# Patient Record
Sex: Male | Born: 1940 | Race: Black or African American | Hispanic: No | Smoking: Former smoker
Health system: Southern US, Community
[De-identification: ages and names within clinical notes are randomized; demographics above are authoritative.]

## PROBLEM LIST (undated history)

## (undated) DIAGNOSIS — I219 Acute myocardial infarction, unspecified: Secondary | ICD-10-CM

## (undated) DIAGNOSIS — I2510 Atherosclerotic heart disease of native coronary artery without angina pectoris: Secondary | ICD-10-CM

## (undated) DIAGNOSIS — I10 Essential (primary) hypertension: Secondary | ICD-10-CM

## (undated) DIAGNOSIS — Z992 Dependence on renal dialysis: Secondary | ICD-10-CM

## (undated) DIAGNOSIS — E119 Type 2 diabetes mellitus without complications: Secondary | ICD-10-CM

## (undated) DIAGNOSIS — N186 End stage renal disease: Secondary | ICD-10-CM

## (undated) HISTORY — PX: OTHER SURGICAL HISTORY: SHX169

## (undated) HISTORY — PX: EYE SURGERY: SHX253

---

## 2019-02-11 ENCOUNTER — Emergency Department: Payer: Medicare Other

## 2019-02-11 ENCOUNTER — Inpatient Hospital Stay: Payer: Medicare Other

## 2019-02-11 ENCOUNTER — Other Ambulatory Visit: Payer: Self-pay

## 2019-02-11 ENCOUNTER — Encounter: Payer: Self-pay | Admitting: Emergency Medicine

## 2019-02-11 ENCOUNTER — Inpatient Hospital Stay
Admission: EM | Admit: 2019-02-11 | Discharge: 2019-02-17 | DRG: 056 | Disposition: A | Discharge status: Skilled Nursing Facility | Payer: Medicare Other | Attending: Internal Medicine | Admitting: Internal Medicine

## 2019-02-11 DIAGNOSIS — E1122 Type 2 diabetes mellitus with diabetic chronic kidney disease: Secondary | ICD-10-CM | POA: Diagnosis present

## 2019-02-11 DIAGNOSIS — Z79899 Other long term (current) drug therapy: Secondary | ICD-10-CM

## 2019-02-11 DIAGNOSIS — R509 Fever, unspecified: Secondary | ICD-10-CM | POA: Diagnosis not present

## 2019-02-11 DIAGNOSIS — N3000 Acute cystitis without hematuria: Secondary | ICD-10-CM | POA: Diagnosis present

## 2019-02-11 DIAGNOSIS — G9341 Metabolic encephalopathy: Secondary | ICD-10-CM | POA: Diagnosis present

## 2019-02-11 DIAGNOSIS — R29701 NIHSS score 1: Secondary | ICD-10-CM | POA: Diagnosis present

## 2019-02-11 DIAGNOSIS — R4182 Altered mental status, unspecified: Secondary | ICD-10-CM | POA: Diagnosis present

## 2019-02-11 DIAGNOSIS — E785 Hyperlipidemia, unspecified: Secondary | ICD-10-CM | POA: Diagnosis present

## 2019-02-11 DIAGNOSIS — Z833 Family history of diabetes mellitus: Secondary | ICD-10-CM

## 2019-02-11 DIAGNOSIS — D631 Anemia in chronic kidney disease: Secondary | ICD-10-CM | POA: Diagnosis present

## 2019-02-11 DIAGNOSIS — I252 Old myocardial infarction: Secondary | ICD-10-CM | POA: Diagnosis not present

## 2019-02-11 DIAGNOSIS — Z20828 Contact with and (suspected) exposure to other viral communicable diseases: Secondary | ICD-10-CM | POA: Diagnosis present

## 2019-02-11 DIAGNOSIS — I340 Nonrheumatic mitral (valve) insufficiency: Secondary | ICD-10-CM | POA: Diagnosis not present

## 2019-02-11 DIAGNOSIS — Z87891 Personal history of nicotine dependence: Secondary | ICD-10-CM | POA: Diagnosis not present

## 2019-02-11 DIAGNOSIS — R5383 Other fatigue: Secondary | ICD-10-CM | POA: Diagnosis not present

## 2019-02-11 DIAGNOSIS — E1151 Type 2 diabetes mellitus with diabetic peripheral angiopathy without gangrene: Secondary | ICD-10-CM | POA: Diagnosis present

## 2019-02-11 DIAGNOSIS — B9620 Unspecified Escherichia coli [E. coli] as the cause of diseases classified elsewhere: Secondary | ICD-10-CM | POA: Diagnosis present

## 2019-02-11 DIAGNOSIS — Z1612 Extended spectrum beta lactamase (ESBL) resistance: Secondary | ICD-10-CM | POA: Diagnosis present

## 2019-02-11 DIAGNOSIS — R531 Weakness: Secondary | ICD-10-CM | POA: Diagnosis not present

## 2019-02-11 DIAGNOSIS — N2581 Secondary hyperparathyroidism of renal origin: Secondary | ICD-10-CM | POA: Diagnosis present

## 2019-02-11 DIAGNOSIS — I361 Nonrheumatic tricuspid (valve) insufficiency: Secondary | ICD-10-CM | POA: Diagnosis not present

## 2019-02-11 DIAGNOSIS — R4701 Aphasia: Secondary | ICD-10-CM | POA: Diagnosis present

## 2019-02-11 DIAGNOSIS — I120 Hypertensive chronic kidney disease with stage 5 chronic kidney disease or end stage renal disease: Secondary | ICD-10-CM | POA: Diagnosis present

## 2019-02-11 DIAGNOSIS — N400 Enlarged prostate without lower urinary tract symptoms: Secondary | ICD-10-CM | POA: Diagnosis present

## 2019-02-11 DIAGNOSIS — Z992 Dependence on renal dialysis: Secondary | ICD-10-CM | POA: Diagnosis not present

## 2019-02-11 DIAGNOSIS — G8191 Hemiplegia, unspecified affecting right dominant side: Secondary | ICD-10-CM | POA: Diagnosis present

## 2019-02-11 DIAGNOSIS — N186 End stage renal disease: Secondary | ICD-10-CM | POA: Diagnosis present

## 2019-02-11 DIAGNOSIS — H548 Legal blindness, as defined in USA: Secondary | ICD-10-CM | POA: Diagnosis present

## 2019-02-11 DIAGNOSIS — N390 Urinary tract infection, site not specified: Secondary | ICD-10-CM

## 2019-02-11 DIAGNOSIS — Z794 Long term (current) use of insulin: Secondary | ICD-10-CM | POA: Diagnosis not present

## 2019-02-11 DIAGNOSIS — I2510 Atherosclerotic heart disease of native coronary artery without angina pectoris: Secondary | ICD-10-CM | POA: Diagnosis present

## 2019-02-11 HISTORY — DX: Dependence on renal dialysis: Z99.2

## 2019-02-11 HISTORY — DX: End stage renal disease: N18.6

## 2019-02-11 HISTORY — DX: Type 2 diabetes mellitus without complications: E11.9

## 2019-02-11 HISTORY — DX: Acute myocardial infarction, unspecified: I21.9

## 2019-02-11 HISTORY — DX: Atherosclerotic heart disease of native coronary artery without angina pectoris: I25.10

## 2019-02-11 HISTORY — DX: Essential (primary) hypertension: I10

## 2019-02-11 LAB — CBC WITH DIFFERENTIAL/PLATELET
Abs Immature Granulocytes: 0.02 10*3/uL (ref 0.00–0.07)
Basophils Absolute: 0 10*3/uL (ref 0.0–0.1)
Basophils Relative: 0 %
Eosinophils Absolute: 0.1 10*3/uL (ref 0.0–0.5)
Eosinophils Relative: 2 %
HCT: 35.8 % — ABNORMAL LOW (ref 39.0–52.0)
Hemoglobin: 11.2 g/dL — ABNORMAL LOW (ref 13.0–17.0)
Immature Granulocytes: 0 %
Lymphocytes Relative: 29 %
Lymphs Abs: 2.1 10*3/uL (ref 0.7–4.0)
MCH: 31.4 pg (ref 26.0–34.0)
MCHC: 31.3 g/dL (ref 30.0–36.0)
MCV: 100.3 fL — ABNORMAL HIGH (ref 80.0–100.0)
Monocytes Absolute: 1.1 10*3/uL — ABNORMAL HIGH (ref 0.1–1.0)
Monocytes Relative: 15 %
Neutro Abs: 4 10*3/uL (ref 1.7–7.7)
Neutrophils Relative %: 54 %
Platelets: 168 10*3/uL (ref 150–400)
RBC: 3.57 MIL/uL — ABNORMAL LOW (ref 4.22–5.81)
RDW: 14.5 % (ref 11.5–15.5)
WBC: 7.4 10*3/uL (ref 4.0–10.5)
nRBC: 0 % (ref 0.0–0.2)

## 2019-02-11 LAB — URINALYSIS, COMPLETE (UACMP) WITH MICROSCOPIC
Bilirubin Urine: NEGATIVE
Glucose, UA: NEGATIVE mg/dL
Ketones, ur: NEGATIVE mg/dL
Nitrite: NEGATIVE
Protein, ur: 100 mg/dL — AB
Specific Gravity, Urine: 1.013 (ref 1.005–1.030)
WBC, UA: >50 WBC/hpf — ABNORMAL HIGH (ref 0–5)
pH: 6 (ref 5.0–8.0)

## 2019-02-11 LAB — PROTIME-INR
INR: 1 (ref 0.8–1.2)
Prothrombin Time: 13.1 seconds (ref 11.4–15.2)

## 2019-02-11 LAB — SARS CORONAVIRUS 2 BY RT PCR (HOSPITAL ORDER, PERFORMED IN ~~LOC~~ HOSPITAL LAB): SARS Coronavirus 2: NEGATIVE

## 2019-02-11 LAB — COMPREHENSIVE METABOLIC PANEL
ALT: 15 U/L (ref 0–44)
AST: 18 U/L (ref 15–41)
Albumin: 3.6 g/dL (ref 3.5–5.0)
Alkaline Phosphatase: 58 U/L (ref 38–126)
Anion gap: 13 (ref 5–15)
BUN: 25 mg/dL — ABNORMAL HIGH (ref 8–23)
CO2: 29 mmol/L (ref 22–32)
Calcium: 9.1 mg/dL (ref 8.9–10.3)
Chloride: 94 mmol/L — ABNORMAL LOW (ref 98–111)
Creatinine, Ser: 7.57 mg/dL — ABNORMAL HIGH (ref 0.61–1.24)
GFR calc Af Amer: 7 mL/min — ABNORMAL LOW (ref >60)
GFR calc non Af Amer: 6 mL/min — ABNORMAL LOW (ref >60)
Glucose, Bld: 107 mg/dL — ABNORMAL HIGH (ref 70–99)
Potassium: 3.6 mmol/L (ref 3.5–5.1)
Sodium: 136 mmol/L (ref 135–145)
Total Bilirubin: 0.8 mg/dL (ref 0.3–1.2)
Total Protein: 7.3 g/dL (ref 6.5–8.1)

## 2019-02-11 LAB — APTT: aPTT: 32 seconds (ref 24–36)

## 2019-02-11 LAB — HEMOGLOBIN A1C
Hgb A1c MFr Bld: 6.9 % — ABNORMAL HIGH (ref 4.8–5.6)
Mean Plasma Glucose: 151.33 mg/dL

## 2019-02-11 LAB — LACTIC ACID, PLASMA
Lactic Acid, Venous: 0.9 mmol/L (ref 0.5–1.9)
Lactic Acid, Venous: 0.9 mmol/L (ref 0.5–1.9)

## 2019-02-11 LAB — PROCALCITONIN: Procalcitonin: 0.99 ng/mL

## 2019-02-11 LAB — GLUCOSE, CAPILLARY: Glucose-Capillary: 191 mg/dL — ABNORMAL HIGH (ref 70–99)

## 2019-02-11 MED ORDER — BRIMONIDINE TARTRATE 0.2 % OP SOLN
1.0000 [drp] | Freq: Two times a day (BID) | OPHTHALMIC | Status: DC
  Administered 2019-02-11 – 2019-02-17 (×11): 1 [drp] via OPHTHALMIC
  Filled 2019-02-11 (×2): qty 5

## 2019-02-11 MED ORDER — VANCOMYCIN HCL IN DEXTROSE 1-5 GM/200ML-% IV SOLN: 1000.0000 mg | Freq: Once | INTRAVENOUS | Status: DC

## 2019-02-11 MED ORDER — PANTOPRAZOLE SODIUM 40 MG PO TBEC
40.0000 mg | DELAYED_RELEASE_TABLET | Freq: Every day | ORAL | Status: DC
  Administered 2019-02-12 – 2019-02-17 (×6): 40 mg via ORAL
  Filled 2019-02-11 (×7): qty 1

## 2019-02-11 MED ORDER — SODIUM CHLORIDE 0.9 % IV SOLN
1.0000 g | INTRAVENOUS | Status: DC
  Administered 2019-02-12 – 2019-02-13 (×2): 1 g via INTRAVENOUS
  Filled 2019-02-11: qty 10
  Filled 2019-02-11 (×2): qty 1

## 2019-02-11 MED ORDER — SODIUM CHLORIDE 0.9 % IV SOLN
1.0000 g | Freq: Once | INTRAVENOUS | Status: AC
  Administered 2019-02-11: 1 g via INTRAVENOUS
  Filled 2019-02-11: qty 10

## 2019-02-11 MED ORDER — ENOXAPARIN SODIUM 40 MG/0.4ML ~~LOC~~ SOLN: 40.0000 mg | SUBCUTANEOUS | Status: DC

## 2019-02-11 MED ORDER — STROKE: EARLY STAGES OF RECOVERY BOOK: Freq: Once | Status: DC

## 2019-02-11 MED ORDER — INSULIN ASPART 100 UNIT/ML ~~LOC~~ SOLN
0.0000 [IU] | Freq: Three times a day (TID) | SUBCUTANEOUS | Status: DC
  Administered 2019-02-12 – 2019-02-13 (×3): 1 [IU] via SUBCUTANEOUS
  Administered 2019-02-13 – 2019-02-14 (×2): 2 [IU] via SUBCUTANEOUS
  Administered 2019-02-14 – 2019-02-16 (×3): 1 [IU] via SUBCUTANEOUS
  Administered 2019-02-16: 12:00:00 2 [IU] via SUBCUTANEOUS
  Administered 2019-02-17: 1 [IU] via SUBCUTANEOUS
  Administered 2019-02-17: 2 [IU] via SUBCUTANEOUS
  Filled 2019-02-11 (×11): qty 1

## 2019-02-11 MED ORDER — GABAPENTIN 100 MG PO CAPS
100.0000 mg | ORAL_CAPSULE | Freq: Every day | ORAL | Status: DC
  Administered 2019-02-11 – 2019-02-13 (×3): 100 mg via ORAL
  Filled 2019-02-11 (×4): qty 1

## 2019-02-11 MED ORDER — SODIUM CHLORIDE 0.9 % IV SOLN
INTRAVENOUS | Status: DC | PRN
  Administered 2019-02-11: 10 mL via INTRAVENOUS
  Administered 2019-02-12: 250 mL via INTRAVENOUS

## 2019-02-11 MED ORDER — ASPIRIN EC 325 MG PO TBEC
325.0000 mg | DELAYED_RELEASE_TABLET | Freq: Once | ORAL | Status: AC
  Administered 2019-02-11: 23:00:00 325 mg via ORAL
  Filled 2019-02-11: qty 1

## 2019-02-11 MED ORDER — VANCOMYCIN HCL IN DEXTROSE 1-5 GM/200ML-% IV SOLN
1000.0000 mg | Freq: Once | INTRAVENOUS | Status: AC
  Administered 2019-02-11: 1000 mg via INTRAVENOUS
  Filled 2019-02-11: qty 200

## 2019-02-11 MED ORDER — INSULIN ASPART 100 UNIT/ML ~~LOC~~ SOLN: 0.0000 [IU] | Freq: Every day | SUBCUTANEOUS | Status: DC

## 2019-02-11 MED ORDER — CARVEDILOL 3.125 MG PO TABS
12.5000 mg | ORAL_TABLET | Freq: Two times a day (BID) | ORAL | Status: DC
  Administered 2019-02-11 – 2019-02-17 (×11): 12.5 mg via ORAL
  Filled 2019-02-11: qty 4
  Filled 2019-02-11: qty 1
  Filled 2019-02-11: qty 4
  Filled 2019-02-11: qty 1
  Filled 2019-02-11 (×2): qty 4
  Filled 2019-02-11 (×5): qty 1

## 2019-02-11 MED ORDER — ASPIRIN EC 325 MG PO TBEC: 325.0000 mg | DELAYED_RELEASE_TABLET | ORAL | Status: DC

## 2019-02-11 MED ORDER — HEPARIN SODIUM (PORCINE) 5000 UNIT/ML IJ SOLN
5000.0000 [IU] | Freq: Three times a day (TID) | INTRAMUSCULAR | Status: DC
  Administered 2019-02-11 – 2019-02-17 (×17): 5000 [IU] via SUBCUTANEOUS
  Filled 2019-02-11 (×17): qty 1

## 2019-02-11 MED ORDER — ATORVASTATIN CALCIUM 20 MG PO TABS: 40.0000 mg | ORAL_TABLET | Freq: Every day | ORAL | Status: DC

## 2019-02-11 MED ORDER — INSULIN GLARGINE 100 UNIT/ML ~~LOC~~ SOLN
8.0000 [IU] | Freq: Every day | SUBCUTANEOUS | Status: DC
  Administered 2019-02-11 – 2019-02-16 (×5): 8 [IU] via SUBCUTANEOUS
  Filled 2019-02-11 (×7): qty 0.08

## 2019-02-11 MED ORDER — ACETAMINOPHEN 325 MG PO TABS
650.0000 mg | ORAL_TABLET | Freq: Four times a day (QID) | ORAL | Status: DC | PRN
  Administered 2019-02-11 – 2019-02-16 (×4): 650 mg via ORAL
  Filled 2019-02-11 (×4): qty 2

## 2019-02-11 MED ORDER — ASPIRIN EC 81 MG PO TBEC
81.0000 mg | DELAYED_RELEASE_TABLET | Freq: Every day | ORAL | Status: DC
  Administered 2019-02-12 – 2019-02-17 (×6): 81 mg via ORAL
  Filled 2019-02-11 (×7): qty 1

## 2019-02-11 MED ORDER — DORZOLAMIDE HCL-TIMOLOL MAL 2-0.5 % OP SOLN
1.0000 [drp] | Freq: Two times a day (BID) | OPHTHALMIC | Status: DC
  Administered 2019-02-11 – 2019-02-17 (×11): 1 [drp] via OPHTHALMIC
  Filled 2019-02-11 (×2): qty 10

## 2019-02-11 MED ORDER — AMLODIPINE BESYLATE 10 MG PO TABS
10.0000 mg | ORAL_TABLET | Freq: Every day | ORAL | Status: DC
  Administered 2019-02-12 – 2019-02-17 (×6): 10 mg via ORAL
  Filled 2019-02-11 (×8): qty 1

## 2019-02-11 MED ORDER — TAMSULOSIN HCL 0.4 MG PO CAPS
0.4000 mg | ORAL_CAPSULE | Freq: Every day | ORAL | Status: DC
  Administered 2019-02-12 – 2019-02-17 (×6): 0.4 mg via ORAL
  Filled 2019-02-11 (×7): qty 1

## 2019-02-11 MED ORDER — ATORVASTATIN CALCIUM 20 MG PO TABS
40.0000 mg | ORAL_TABLET | Freq: Every day | ORAL | Status: DC
  Administered 2019-02-12 – 2019-02-17 (×6): 40 mg via ORAL
  Filled 2019-02-11 (×6): qty 2

## 2019-02-11 MED ORDER — INSULIN GLARGINE 100 UNIT/ML ~~LOC~~ SOLN: 13.0000 [IU] | Freq: Every day | SUBCUTANEOUS | Status: DC

## 2019-02-11 NOTE — ED Triage Notes (Signed)
Pt here with daughter for AMS.  Normally pt alert and oriented. has had drowsiness and has been leaning towards the right side since dialysis yesterday.  No known fever per daughter.  Pt disoriented to place and time in triage.  Grip strength equal. Is dialysis pt. Pt has never been here before and unable to link through care everywhere at this time. Daughter was unsure of all medical hx.

## 2019-02-11 NOTE — Progress Notes (Signed)
Patient ID: Gregory Davenport, male   DOB: Sep 19, 1940, 78 y.o.   MRN: [id number]  ACP note  Patient and daughter at the bedside  Patient coming in feeling miserable.  Since dialysis yesterday has been feeling weak and not feeling well at all.  The daughter had noticed a fever at home and some confusion.  Patient had right-sided weakness and was unable to transfer this morning.  In the ER he did have some right-sided weakness and can hardly lift his right leg up off the bed.  He was thought to have a urinary infection.  CODE STATUS discussed.  Patient wishes to be a full code.  Plan.  With the patient's right-sided weakness need to rule out stroke.  Given aspirin and MRI of the brain, carotid ultrasound and echocardiogram.  PT and OT consultations.  Treat infection with Rocephin and 1 dose of vancomycin for now until cultures are back.  Time spent on ACP discussion 17 minutes Dr Loletha Grayer

## 2019-02-11 NOTE — Progress Notes (Addendum)
Pt arrived on the unit at [facility] from the ED. Pt was A&Ox4. Pt is blind in both eyes. Pt oriented to surroundings and given red push button call bell. This RN called pts daughter and updated her on pts location and plan of care and she set up the password. Pt able to swallow water through straw with no coughing. Pink sleeve applied to right arm d/t AV fistula. Fall risk and allergy bracelets also applied. Bed in lowest position, bed alarm is on, and the call bed is in hand.

## 2019-02-11 NOTE — Consult Note (Signed)
[facility] TeleNeurology Consult Services  Stat Consult  Date of Service:   02/11/2019 17:23:45  Impression:     .  Rule Out Acute Ischemic Stroke     .  Urinary tract infection with metabolic encephalopathy     .    Comments/Sign-Out: I discussed the case in detail with the patient. Also discussed the case with the staff. He has some right leg weakness. His MRIs and CTs have been negative. He still could have a small stroke that was not seen on the MRI. I would recommend treating him as a possible stroke along with UTI. He should get physical therapy, occupational therapy eval's. He should follow-up with neurology. Depending on his progress, will plan further care  CT HEAD: Showed No Acute Hemorrhage or Acute Core Infarct Reviewed  Metrics: [facility] Notification Time: 02/11/2019 17:21:48 Stamp Time: 02/11/2019 17:23:45 Callback Response Time: 02/11/2019 17:22:27 Video Start Time: 02/11/2019 18:33:31 Video End Time: 02/11/2019 18:41:21  Our recommendations are outlined below.  Recommendations:     .  Antiplatelet Therapy    Therapies:     .  Physical Therapy, Occupational Therapy, Speech Therapy Assessment When Applicable  Other WorkUp:     .  Check an ammonia level     .  Check B12 level     .  Check Urinalysis  Disposition: Neurology Follow Up Recommended  Sign Out:     .  Discussed with Rapid Response Team  ----------------------------------------------------------------------------------------------------  Chief Complaint: Altered mental status  History of Present Illness: Patient is a 78 year old Male.  78 year old male with history of hypertension, diabetes, hyperlipidemia, history of a previous stroke came to the hospital because of some mental status changes and increasing weakness. Patient had dialysis yesterday and after dialysis the family noticed that he was more confused and weak. Patient is lethargic but arousable. He is  answering questions and following commands. He denies any new complaints. He knows he is in the hospital. He reports he had a stroke before and his right side was weaker but he feels that his right leg is slightly weaker than before. He denies any significant back pain.    Past Medical History:     . Hypertension     . Diabetes Mellitus     . Hyperlipidemia     . Coronary Artery Disease     . Stroke  Anticoagulant use:  No  Antiplatelet use: Aspirin Examination: BP(122/62), Pulse(62), Blood Glucose(78) 1A: Level of Consciousness - Alert; keenly responsive + 0 1B: Ask Month and Age - Both Questions Right + 0 1C: Blink Eyes & Squeeze Hands - Performs Both Tasks + 0 2: Test Horizontal Extraocular Movements - Normal + 0 3: Test Visual Fields - No Visual Loss + 0 4: Test Facial Palsy (Use Grimace if Obtunded) - Normal symmetry + 0 5A: Test Left Arm Motor Drift - No Drift for 10 Seconds + 0 5B: Test Right Arm Motor Drift - No Drift for 10 Seconds + 0 6A: Test Left Leg Motor Drift - No Drift for 5 Seconds + 0 6B: Test Right Leg Motor Drift - Drift, but doesn't hit bed + 1 7: Test Limb Ataxia (FNF/Heel-Shin) - No Ataxia + 0 8: Test Sensation - Normal; No sensory loss + 0 9: Test Language/Aphasia - Normal; No aphasia + 0 10: Test Dysarthria - Normal + 0 11: Test Extinction/Inattention - No abnormality + 0  NIHSS Score: 1  Patient/Family was informed the Neurology Consult would happen  via TeleHealth consult by way of interactive audio and video telecommunications and consented to receiving care in this manner.  Due to the immediate potential for life-threatening deterioration due to underlying acute neurologic illness, I spent 30 minutes providing critical care. This time includes time for face to face visit via telemedicine, review of medical records, imaging studies and discussion of findings with providers, the patient and/or family.   Dr Faustino Congress   [facility] [phone]  Case [id number]

## 2019-02-11 NOTE — ED Notes (Signed)
Asked RN Anderson Malta to attempt IV w/o success, RN Jinny Blossom attempted to obtain IV access w/o success.

## 2019-02-11 NOTE — Progress Notes (Signed)
Patient ID: Gregory Davenport, male   DOB: [birth year], 78 y.o.   MRN: [id number]  I spoke with neurologist Dr. Claretta Fraise.  He recommended getting a telemetry neuro consultation.  Patient started having symptoms yesterday after dialysis with right-sided weakness around 5 PM.  Spoke with ER physician and will get [facility] neuro consultation.  Dr Loletha Grayer

## 2019-02-11 NOTE — Progress Notes (Signed)
Anticoagulation monitoring(Lovenox):  78 yo male ordered Lovenox 40 mg Q24h  Filed Weights   02/11/19 1940  Weight: 202 lb 9.6 oz (91.9 kg)   BMI    Lab Results  Component Value Date   CREATININE 7.57 (H) 02/11/2019   Estimated Creatinine Clearance: 8.5 mL/min (A) (by C-G formula based on SCr of 7.57 mg/dL (H)). Hemoglobin & Hematocrit     Component Value Date/Time   HGB 11.2 (L) 02/11/2019 1127   HCT 35.8 (L) 02/11/2019 1127     Per Protocol for Patient with estCrcl < 15 ml/min and BMI < 40, will transition to Heparin 5000 units SQ Q8H.

## 2019-02-11 NOTE — H&P (Signed)
[facility] [place] NAME: Gregory Davenport    MR#:  [id number]  DATE OF BIRTH:  [birth year]  DATE OF ADMISSION:  02/11/2019  PRIMARY CARE PHYSICIAN: Delilah Shan, MD   REQUESTING/REFERRING PHYSICIAN: Dr Lavonia Drafts  CHIEF COMPLAINT:   Chief Complaint  Patient presents with  . Altered Mental Status    HISTORY OF PRESENT ILLNESS:  Gregory Davenport  is a 78 y.o. male with a known history of end-stage renal disease.  He states that he feels terrible. After dialysis yesterday he felt a little bit weak.  Daughter states he had a fever of 100.9.  He did have some confusion.  She felt that his right side was weaker. Today they could not even transfer him.  Normally is able to walk a little bit with a walker.  The patient had a COVID test which was negative, chest x-ray was negative.  Urine analysis looked positive.  He only urinates once per day.  Patient also having right-sided weakness and can hardly lift his right leg up off the bed and his grip strength is a little weaker with his right arm.  Hospitalist services contacted for further evaluation.  PAST MEDICAL HISTORY:   Past Medical History:  Diagnosis Date  . CAD (coronary artery disease)   . Diabetes mellitus without complication ([place])   . Dialysis patient ([place])   . End stage renal disease ([place])   . Heart attack ([place])   . Hypertension     PAST SURGICAL HISTORY:   Past Surgical History:  Procedure Laterality Date  . EYE SURGERY    . fistula right arm      SOCIAL HISTORY:   Social History   Tobacco Use  . Smoking status: Former Research scientist (life sciences)  . Smokeless tobacco: Never Used  Substance Use Topics  . Alcohol use: Never    Frequency: Never    FAMILY HISTORY:   Family History  Problem Relation Age of Onset  . Dementia Mother   . Hypertension Mother   . Diabetes Sister   . Kidney failure Sister     DRUG ALLERGIES:   Allergies  Allergen Reactions  . Other Other  (See Comments)    Combative with opioid medications    REVIEW OF SYSTEMS:  CONSTITUTIONAL: Positive for fever.positive for fatigue and weakness.  EYES: Patient is blind EARS, NOSE, AND THROAT: No tinnitus or ear pain. No sore throat RESPIRATORY: No cough, shortness of breath, wheezing or hemoptysis.  CARDIOVASCULAR: No chest pain, orthopnea, edema.  GASTROINTESTINAL: Positive for nausea, and dry heaving.  No diarrhea or abdominal pain. No blood in bowel movements.  History of chronic constipation. GENITOURINARY: No dysuria, hematuria.  Urinates once per day. ENDOCRINE: No polyuria, nocturia,  HEMATOLOGY: No anemia, easy bruising or bleeding SKIN: No rash or lesion. MUSCULOSKELETAL: No joint pain or arthritis.   NEUROLOGIC: Right-sided weakness PSYCHIATRY: No anxiety or depression.   MEDICATIONS AT HOME:   Prior to Admission medications   Medication Sig Start Date End Date Taking? Authorizing Provider  amLODipine (NORVASC) 10 MG tablet Take 10 mg by mouth daily.   Yes [provider]  atorvastatin (LIPITOR) 40 MG tablet Take 40 mg by mouth daily.   Yes [provider]  brimonidine (ALPHAGAN) 0.2 % ophthalmic solution Place 1 drop into the right eye 2 (two) times daily.   Yes [provider]  carvedilol (COREG) 12.5 MG tablet Take 12.5 mg by mouth 2 (two) times daily.  Yes [provider]  dorzolamide-timolol (COSOPT) 22.3-6.8 MG/ML ophthalmic solution Place 1 drop into both eyes 2 (two) times daily.   Yes [provider]  gabapentin (NEURONTIN) 100 MG capsule Take 100 mg by mouth at bedtime.   Yes [provider]  insulin glargine (LANTUS) 100 UNIT/ML injection Inject 13 Units into the skin at bedtime.   Yes [provider]  pantoprazole (PROTONIX) 40 MG tablet Take 40 mg by mouth daily.   Yes [provider]  tamsulosin (FLOMAX) 0.4 MG CAPS capsule Take 0.4 mg by mouth daily.   Yes [provider]       VITAL SIGNS:  Blood pressure (!) 153/73, pulse 73, temperature 99.8 F (37.7 C), temperature source Oral, resp. rate 16, SpO2 98 %.  PHYSICAL EXAMINATION:  GENERAL:  78 y.o.-year-old patient lying in the bed with no acute distress.  EYES: Pupils clouded over HEENT: Head atraumatic, normocephalic. Oropharynx and nasopharynx clear.  NECK:  Supple, no jugular venous distention. No thyroid enlargement, no tenderness.  LUNGS: Normal breath sounds bilaterally, no wheezing, rales,rhonchi or crepitation. No use of accessory muscles of respiration.  CARDIOVASCULAR: S1, S2 normal. No murmurs, rubs, or gallops.  ABDOMEN: Soft, nontender, nondistended. Bowel sounds present. No organomegaly or mass.  EXTREMITIES: No pedal edema, cyanosis, or clubbing.  NEUROLOGIC: Cranial nerves II through XII are intact. Muscle strength 4/5 right upper and lower extremity.  5 out of 5 left upper and lower extremity.. Sensation intact. Gait not checked.  PSYCHIATRIC: The patient is alert and oriented x 3.  SKIN: No rash, lesion, or ulcer.   LABORATORY PANEL:   CBC Recent Labs  Lab 02/11/19 1127  WBC 7.4  HGB 11.2*  HCT 35.8*  PLT 168   ------------------------------------------------------------------------------------------------------------------  Chemistries  Recent Labs  Lab 02/11/19 1127  NA 136  K 3.6  CL 94*  CO2 29  GLUCOSE 107*  BUN 25*  CREATININE 7.57*  CALCIUM 9.1  AST 18  ALT 15  ALKPHOS 58  BILITOT 0.8   ------------------------------------------------------------------------------------------------------------------   RADIOLOGY:  Ct Head Wo Contrast  Result Date: 02/11/2019 CLINICAL DATA:  Altered level of consciousness. EXAM: CT HEAD WITHOUT CONTRAST TECHNIQUE: Contiguous axial images were obtained from the base of the skull through the vertex without intravenous contrast. COMPARISON:  None. FINDINGS: Brain: Atrophy with sulcal prominence centralized volume loss with  commensurate ex vacuo dilatation of the ventricular system. Rather extensive periventricular hypodensities compatible with microvascular ischemic disease. 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 Bilateral basal ganglial calcifications. Given extensive background parenchymal abnormalities, there is no CT evidence of superimposed acute large territory infarct. No intraparenchymal or extra-axial mass or hemorrhage. Normal configuration of the ventricles and the basilar cisterns. No midline shift. Vascular: Intracranial atherosclerosis. Skull: No displaced calvarial fracture. Sinuses/Orbits: There is underpneumatization of the bilateral frontal sinuses. Small air-fluid level noted within the right sphenoid sinus. The remaining paranasal sinuses and mastoid air cells are normally aerated. Post bilateral cataract surgery with additional postoperative change of the left lobe. Other: Regional soft tissues appear normal. IMPRESSION: Advanced atrophy and microvascular ischemic disease without superimposed acute intracranial process. Electronically Signed   By: Sandi Mariscal M.D.   On: 02/11/2019 12:59   Dg Chest Port 1 View  Result Date: 02/11/2019 CLINICAL DATA:  Altered mental status. EXAM: PORTABLE CHEST 1 VIEW COMPARISON:  None. FINDINGS: Normal sized heart. Tortuous and calcified thoracic aorta. Clear lungs. Right axillary stent. Unremarkable bones. IMPRESSION: No acute abnormality. Electronically Signed  By: Claudie Revering M.D.   On: 02/11/2019 11:48    EKG:   Normal sinus rhythm 78 bpm, APC, anterior septal old infarct  IMPRESSION AND PLAN:   1.  Right-sided weakness starting yesterday after dialysis.  CT scan of the head negative.  Will get stroke work-up including MRI of the brain, carotid ultrasound and echocardiogram.  Check a lipid profile.  Give aspirin 325 mg now and 81 mg daily.  Patient already on statin.  Swallow screen.  Physical therapy occupational therapy evaluations.   Monitor on telemetry. 2.  Fever with normal white count.  Urine analysis does look positive but not sure what to make of this in a dialysis patient.  Follow-up blood cultures and urine cultures.  Chest x-ray negative.  Give 1 dose of vancomycin until cultures are back.  Continue the Rocephin started in the ED.  COVID-19 negative. 3.  End-stage renal disease on dialysis Monday Wednesday Friday.  Consult nephrology. 4.  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  Decrease dose of Lantus down to 8 units and sliding scale.  Check a hemoglobin A1c. 5.  Essential hypertension.  Continue usual medications. Allow permissive hypertension today.  Obtain better blood pressure tomorrow.  All the records are reviewed and case discussed with ED provider. Management plans discussed with the patient, family and they are in agreement.  CODE STATUS: Full code  TOTAL TIME TAKING CARE OF THIS PATIENT: 50 minutes, including ACP time.    Loletha Grayer M.D on 02/11/2019 at 3:25 PM  Between 7am to 6pm - Pager - [phone]  After 6pm call admission pager [phone]  [facility]  [phone]  CC: Primary care physician; Delilah Shan, MD

## 2019-02-11 NOTE — ED Provider Notes (Signed)
[facility] Emergency Department Provider Note   ____________________________________________    I have reviewed the triage vital signs and the nursing notes.   HISTORY  Chief Complaint Altered Mental Status   Limited by altered mental status  HPI Gregory Davenport is a 78 y.o. male with a history of diabetes, CAD, end-stage renal disease, hypertension who presents with severe generalized weakness and confusion and fever.  Daughter reports that the patient received dialysis yesterday and after she picked him up he was quite weak and a little bit out of it.  She notes that sometimes that happens after dialysis however his symptoms have continued until today.  She reports at baseline he is able to ambulate without difficulty.  However today she and her whole family had significant difficulty getting him out of bed.  She notes that he is more confused than typical.  She also reports a fever of 100.9 at home.  Patient apparently admitted to [facility] 2 weeks ago for urinary tract infection  Past Medical History:  Diagnosis Date  . CAD (coronary artery disease)   . Diabetes mellitus without complication ([place])   . Dialysis patient ([place])   . End stage renal disease ([place])   . Heart attack ([place])   . Hypertension     Patient Active Problem List   Diagnosis Date Noted  . Right sided weakness 02/11/2019    Past Surgical History:  Procedure Laterality Date  . EYE SURGERY    . fistula right arm      Prior to Admission medications   Medication Sig Start Date End Date Taking? Authorizing Provider  amLODipine (NORVASC) 10 MG tablet Take 10 mg by mouth daily.   Yes [provider]  atorvastatin (LIPITOR) 40 MG tablet Take 40 mg by mouth daily.   Yes [provider]  brimonidine (ALPHAGAN) 0.2 % ophthalmic solution Place 1 drop into the right eye 2 (two) times daily.   Yes [provider]  carvedilol (COREG) 12.5 MG tablet Take 12.5 mg  by mouth 2 (two) times daily.   Yes [provider]  dorzolamide-timolol (COSOPT) 22.3-6.8 MG/ML ophthalmic solution Place 1 drop into both eyes 2 (two) times daily.   Yes [provider]  gabapentin (NEURONTIN) 100 MG capsule Take 100 mg by mouth at bedtime.   Yes [provider]  insulin glargine (LANTUS) 100 UNIT/ML injection Inject 13 Units into the skin at bedtime.   Yes [provider]  pantoprazole (PROTONIX) 40 MG tablet Take 40 mg by mouth daily.   Yes [provider]  tamsulosin (FLOMAX) 0.4 MG CAPS capsule Take 0.4 mg by mouth daily.   Yes [provider]     Allergies Other  Family History  Problem Relation Age of Onset  . Dementia Mother   . Hypertension Mother   . Diabetes Sister   . Kidney failure Sister     Social History Social History   Tobacco Use  . Smoking status: Former Research scientist (life sciences)  . Smokeless tobacco: Never Used  Substance Use Topics  . Alcohol use: Never    Frequency: Never  . Drug use: Never    Review of Systems  Constitutional: No fever/chills Eyes: No visual changes.  ENT: No sore throat. Cardiovascular: Denies chest pain. Respiratory: Denies shortness of breath. Gastrointestinal: No abdominal pain.  No nausea, no vomiting.   Genitourinary: Negative for dysuria. Musculoskeletal: Negative for back pain. Skin: Negative for rash. Neurological: Negative for headaches or weakness  ____________________________________________   PHYSICAL EXAM:  VITAL SIGNS: ED Triage Vitals  Enc Vitals Group     BP 02/11/19 1104 122/62     Pulse Rate 02/11/19 1104 81     Resp 02/11/19 1104 20     Temp 02/11/19 1104 99.8 F (37.7 C)     Temp Source 02/11/19 1104 Oral     SpO2 02/11/19 1104 97 %     Weight --      Height --      Head Circumference --      Peak Flow --      Pain Score 02/11/19 1105 0     Pain Loc --      Pain Edu? --      Excl. in [place]? --     Constitutional: Alert but confused  Eyes: Conjunctivae are normal.  Head: Atraumatic. Nose: No congestion/rhinnorhea. Mouth/Throat: Mucous membranes are moist.   Neck: No pain with range of motion Cardiovascular: Normal rate, regular rhythm.   Good peripheral circulation. Respiratory: Normal respiratory effort.  No retractions. Lungs CTAB. Gastrointestinal: Soft and nontender. No distention.   Genitourinary: deferred Musculoskeletal: No lower extremity tenderness nor edema.  Warm and well perfused.  Right arm fistula, no evidence of infection Neurologic:  Normal speech and language. No gross focal neurologic deficits are appreciated.  Cranial nerves II through XII are normal Skin:  Skin is warm, dry and intact.  Psychiatric: Mood and affect are normal. Speech and behavior are normal.  ____________________________________________   LABS (all labs ordered are listed, but only abnormal results are displayed)  Labs Reviewed  COMPREHENSIVE METABOLIC PANEL - Abnormal; Notable for the following components:      Result Value   Chloride 94 (*)    Glucose, Bld 107 (*)    BUN 25 (*)    Creatinine, Ser 7.57 (*)    GFR calc non Af Amer 6 (*)    GFR calc Af Amer 7 (*)    All other components within normal limits  CBC WITH DIFFERENTIAL/PLATELET - Abnormal; Notable for the following components:   RBC 3.57 (*)    Hemoglobin 11.2 (*)    HCT 35.8 (*)    MCV 100.3 (*)    Monocytes Absolute 1.1 (*)    All other components within normal limits  URINALYSIS, COMPLETE (UACMP) WITH MICROSCOPIC - Abnormal; Notable for the following components:   Color, Urine AMBER (*)    APPearance CLOUDY (*)    Hgb urine dipstick MODERATE (*)    Protein, ur 100 (*)    Leukocytes,Ua LARGE (*)    WBC, UA >50 (*)    Bacteria, UA MANY (*)    All other components within normal limits  SARS CORONAVIRUS 2 BY RT PCR (HOSPITAL ORDER, [place] [facility])  CULTURE, BLOOD (ROUTINE X 2)  CULTURE, BLOOD (ROUTINE X 2)  URINE CULTURE  LACTIC  ACID, PLASMA  LACTIC ACID, PLASMA  APTT  PROTIME-INR  PROCALCITONIN  HEMOGLOBIN A1C   ____________________________________________  EKG  ED ECG REPORT I, Lavonia Drafts, the attending physician, personally viewed and interpreted this ECG.  Date: 02/11/2019  Rhythm: normal sinus rhythm QRS Axis: normal Intervals: normal ST/T Wave abnormalities: normal Narrative Interpretation: no evidence of acute ischemia  ____________________________________________  RADIOLOGY  Chest x-ray unremarkable, CT scan unremarkable ____________________________________________   PROCEDURES  Procedure(s) performed: No  Procedures   Critical Care performed: No ____________________________________________   INITIAL IMPRESSION / ASSESSMENT AND PLAN / ED COURSE  Pertinent labs &  imaging results that were available during my care of the patient were reviewed by me and considered in my medical decision making (see chart for details).  Patient presents with significant generalized weakness and fever raising concern for infection as the cause of his weakness.  Neuro exam is reassuring, no evidence of stroke.  Elevated temperature here in the emergency department.  Given history suspect urinary tract infection as the cause.  Pending labs, chest x-ray, CT head.  Delay in lab work because patient is a very difficult IV access  Lab work is overall reassuring, pending urinalysis  CT head chest x-ray unremarkable  Urinalysis consistent with urinary tract infection, this is likely the cause of his weakness and fever.  Will treat with IV Rocephin, have admitted to the hospital service.  COVID swab is negative    ____________________________________________   FINAL CLINICAL IMPRESSION(S) / ED DIAGNOSES  Final diagnoses:  Acute cystitis without hematuria  Metabolic encephalopathy        Note:  This document was prepared using Dragon voice recognition software and may include unintentional  dictation errors.   Lavonia Drafts, MD 02/11/19 [phone]

## 2019-02-11 NOTE — Progress Notes (Signed)
Patient ID: Gregory Davenport, male   DOB: 09-27-1940, 78 y.o.   MRN: [id number]  MRI brain negative for stroke.  Dr Leslye Peer

## 2019-02-12 ENCOUNTER — Inpatient Hospital Stay (HOSPITAL_COMMUNITY)
Admit: 2019-02-12 | Discharge: 2019-02-12 | Disposition: A | Discharge status: Home / Self Care | Payer: Medicare Other | Attending: Internal Medicine | Admitting: Internal Medicine

## 2019-02-12 DIAGNOSIS — I340 Nonrheumatic mitral (valve) insufficiency: Secondary | ICD-10-CM

## 2019-02-12 DIAGNOSIS — I361 Nonrheumatic tricuspid (valve) insufficiency: Secondary | ICD-10-CM

## 2019-02-12 LAB — CBC
HCT: 37.1 % — ABNORMAL LOW (ref 39.0–52.0)
Hemoglobin: 11.8 g/dL — ABNORMAL LOW (ref 13.0–17.0)
MCH: 31.7 pg (ref 26.0–34.0)
MCHC: 31.8 g/dL (ref 30.0–36.0)
MCV: 99.7 fL (ref 80.0–100.0)
Platelets: 181 10*3/uL (ref 150–400)
RBC: 3.72 MIL/uL — ABNORMAL LOW (ref 4.22–5.81)
RDW: 14.3 % (ref 11.5–15.5)
WBC: 6.2 10*3/uL (ref 4.0–10.5)
nRBC: 0 % (ref 0.0–0.2)

## 2019-02-12 LAB — GLUCOSE, CAPILLARY
Glucose-Capillary: 99 mg/dL (ref 70–99)
Glucose-Capillary: 135 mg/dL — ABNORMAL HIGH (ref 70–99)
Glucose-Capillary: 142 mg/dL — ABNORMAL HIGH (ref 70–99)
Glucose-Capillary: 106 mg/dL — ABNORMAL HIGH (ref 70–99)

## 2019-02-12 LAB — ECHOCARDIOGRAM COMPLETE
Height: 72 in
Weight: 3241.6 oz

## 2019-02-12 LAB — AMMONIA: Ammonia: 25 umol/L (ref 9–35)

## 2019-02-12 LAB — LIPID PANEL
Cholesterol: 151 mg/dL (ref 0–200)
HDL: 31 mg/dL — ABNORMAL LOW (ref >40)
LDL Cholesterol: 95 mg/dL (ref 0–99)
Total CHOL/HDL Ratio: 4.9 RATIO
Triglycerides: 123 mg/dL (ref <150)
VLDL: 25 mg/dL (ref 0–40)

## 2019-02-12 LAB — BASIC METABOLIC PANEL
Anion gap: 14 (ref 5–15)
BUN: 33 mg/dL — ABNORMAL HIGH (ref 8–23)
CO2: 28 mmol/L (ref 22–32)
Calcium: 9 mg/dL (ref 8.9–10.3)
Chloride: 96 mmol/L — ABNORMAL LOW (ref 98–111)
Creatinine, Ser: 9.29 mg/dL — ABNORMAL HIGH (ref 0.61–1.24)
GFR calc Af Amer: 6 mL/min — ABNORMAL LOW (ref >60)
GFR calc non Af Amer: 5 mL/min — ABNORMAL LOW (ref >60)
Glucose, Bld: 116 mg/dL — ABNORMAL HIGH (ref 70–99)
Potassium: 3.7 mmol/L (ref 3.5–5.1)
Sodium: 138 mmol/L (ref 135–145)

## 2019-02-12 LAB — VITAMIN B12: Vitamin B-12: 607 pg/mL (ref 180–914)

## 2019-02-12 MED ORDER — CHLORHEXIDINE GLUCONATE CLOTH 2 % EX PADS
6.0000 | MEDICATED_PAD | Freq: Every day | CUTANEOUS | Status: DC
  Administered 2019-02-13 – 2019-02-17 (×5): 6 via TOPICAL

## 2019-02-12 NOTE — Evaluation (Signed)
Physical Therapy Evaluation Patient Details Name: Gregory Davenport MRN: [id number] DOB: [birth year] Today's Date: 02/12/2019   History of Present Illness  From MD H&P: Pt is an 78 y.o. male with a known history of end-stage renal disease.  He states that he feels terrible. After dialysis yesterday he felt a little bit weak.  He did have some confusion.  She felt that his right side was weaker. Today they could not even transfer him.  Normally is able to walk a little bit with a walker.  Urine analysis looked positive. Patient also having right-sided weakness and can hardly lift his right leg up off the bed and his grip strength is a little weaker with his right arm.  Hospitalist services contacted for further evaluation.  Per MD assessment CT and MRI both (-) for CVA.  Assessment includes: Right-sided weakness, fever with normal white count, ESRD on HD M,W,F, DM II, and HTN.    Clinical Impression  Pt presented with deficits in strength, transfers, mobility, gait, balance, and activity tolerance.  Pt required min A for bed mobility tasks and Mod A to stand from an elevated EOB.  Pt was able to amb a max of 4 feet x 2 with seated therapeutic rest breaks between walks.  Pt's RLE presented with very mild buckling occasionally while walking with the pt able to self-correct.  No neurological deficits noted to UE strength, sensation, or coordination.  BLE sensation to light touch and gross motor coordination intact.  Pt's RLE was somewhat weaker than the LLE during MMT and pt reported noticing that the RLE felt weaker to him during functional tasks.  At baseline the pt is able to amb [facility] distances with a SPC, stand from sitting, and get in and out of bed without physical assistance and currently presents with a significant decline in function compared to his stated baseline.  Pt would not be safe to return to his prior living situation at this time.  Pt will benefit from PT services in a SNF setting upon discharge to  safely address above deficits for decreased caregiver assistance and eventual return to PLOF.      Follow Up Recommendations SNF    Equipment Recommendations  None recommended by PT    Recommendations for Other Services       Precautions / Restrictions Precautions Precautions: Fall Restrictions Weight Bearing Restrictions: No Other Position/Activity Restrictions: Per patient, h/o being legally blind      Mobility  Bed Mobility Overal bed mobility: Needs Assistance Bed Mobility: Supine to Sit;Sit to Supine     Supine to sit: Min assist Sit to supine: Min assist   General bed mobility comments: Min A for RLE and trunk control  Transfers Overall transfer level: Needs assistance Equipment used: Rolling walker (2 wheeled) Transfers: Sit to/from Stand Sit to Stand: Mod assist;From elevated surface         General transfer comment: Mod A and mod verbal and tactile cues for sequencing pt requiring multiple rocking attempts with Mod A to stand  Ambulation/Gait Ambulation/Gait assistance: Min assist Gait Distance (Feet): 4 Feet x 2 Assistive device: Rolling walker (2 wheeled) Gait Pattern/deviations: Step-through pattern;Decreased step length - right;Decreased step length - left Gait velocity: decreased   General Gait Details: Min A to guide the RW with verbal cues for general sequencing  Stairs            Wheelchair Mobility    Modified Rankin (Stroke Patients Only)       Balance  Overall balance assessment: Needs assistance   Sitting balance-Leahy Scale: Good     Standing balance support: Bilateral upper extremity supported Standing balance-Leahy Scale: Fair Standing balance comment: Mod lean on the RW for support                             Pertinent Vitals/Pain Pain Assessment: No/denies pain    Home Living Family/patient expects to be discharged to:: Private residence Living Arrangements: Children(Lives with two  daughters) Available Help at Discharge: Family;Available PRN/intermittently Type of Home: House Home Access: Stairs to enter Entrance Stairs-Rails: Right;Left;Can reach both Entrance Stairs-Number of Steps: 3 Home Layout: One level Home Equipment: Shower seat - built in;Cane - single point;Wheelchair - [employer] - 2 wheels      Prior Function Level of Independence: Independent with assistive device(s)         Comments: Pt Mod Ind with amb HH distances with a SPC and uses his w/c in the community; pt stated that he is legally blind but is able to ambulate without assistance in the home due to being familiar with the layout; 3 falls in the last year secondary to [place]        Extremity/Trunk Assessment   Upper Extremity Assessment Upper Extremity Assessment: Overall WFL for tasks assessed    Lower Extremity Assessment Lower Extremity Assessment: Generalized weakness;RLE deficits/detail RLE Deficits / Details: RLE grossly 3+ to 4-/5 compared to LLE 4/5 RLE Sensation: WNL       Communication   Communication: No difficulties;Other (comment)(Pt reports being legally blind)  Cognition Arousal/Alertness: Awake/alert Behavior During Therapy: WFL for tasks assessed/performed Overall Cognitive Status: Within Functional Limits for tasks assessed                                        General Comments      Exercises Total Joint Exercises Ankle Circles/Pumps: AROM;Both;10 reps Quad Sets: Strengthening;Both;10 reps Gluteal Sets: Strengthening;Both;10 reps Heel Slides: AROM;AAROM;Both;5 reps Hip ABduction/ADduction: AROM;AAROM;Both;5 reps Straight Leg Raises: AROM;AAROM;Both;5 reps Long Arc Quad: Strengthening;Both;5 reps;10 reps Knee Flexion: Strengthening;Both;5 reps;10 reps Marching in Standing: AROM;Both;10 reps;Standing   Assessment/Plan    PT Assessment Patient needs continued PT services  PT Problem List Decreased  strength;Decreased balance;Decreased mobility;Decreased activity tolerance;Decreased knowledge of use of DME       PT Treatment Interventions DME instruction;Gait training;Stair training;Functional mobility training;Therapeutic activities;Therapeutic exercise;Balance training;Patient/family education    PT Goals (Current goals can be found in the Care Plan section)  Acute Rehab PT Goals Patient Stated Goal: To walk better PT Goal Formulation: With patient Time For Goal Achievement: 02/25/19 Potential to Achieve Goals: Good    Frequency Min 2X/week   Barriers to discharge Inaccessible home environment;Decreased caregiver support Pt has stairs to enter the home and limited assistance during the day    Co-evaluation               AM-PAC PT "6 Clicks" Mobility  Outcome Measure Help needed turning from your back to your side while in a flat bed without using bedrails?: A Little Help needed moving from lying on your back to sitting on the side of a flat bed without using bedrails?: A Little Help needed moving to and from a bed to a chair (including a wheelchair)?: A Lot Help needed standing up from a chair using  your arms (e.g., wheelchair or bedside chair)?: A Lot Help needed to walk in hospital room?: Total Help needed climbing 3-5 steps with a railing? : Total 6 Click Score: 12    End of Session Equipment Utilized During Treatment: Gait belt Activity Tolerance: Patient tolerated treatment well Patient left: in chair;with chair alarm set;with family/visitor present;with call bell/phone within reach Nurse Communication: Mobility status PT Visit Diagnosis: Unsteadiness on feet (R26.81);History of falling (Z91.81);Difficulty in walking, not elsewhere classified (R26.2);Muscle weakness (generalized) (M62.81)    Time: 1320-1350 PT Time Calculation (min) (ACUTE ONLY): 30 min   Charges:   PT Evaluation $PT Eval Moderate Complexity: 1 Mod PT Treatments $Therapeutic Exercise:  8-22 mins      D. Royetta Asal PT, DPT 02/12/19, 4:39 PM

## 2019-02-12 NOTE — Progress Notes (Signed)
[facility] [place] NAME: Gregory Davenport    MR#:  [id number]  DATE OF BIRTH:  1940-09-30  SUBJECTIVE:  CHIEF COMPLAINT:   Chief Complaint  Patient presents with  . Altered Mental Status   Came with right-sided weakness.  Currently feels weakness is improving some. Noted no acute stroke on MRI.  REVIEW OF SYSTEMS:  CONSTITUTIONAL: No fever, fatigue or weakness.  EYES: No blurred or double vision.  EARS, NOSE, AND THROAT: No tinnitus or ear pain.  RESPIRATORY: No cough, shortness of breath, wheezing or hemoptysis.  CARDIOVASCULAR: No chest pain, orthopnea, edema.  GASTROINTESTINAL: No nausea, vomiting, diarrhea or abdominal pain.  GENITOURINARY: No dysuria, hematuria.  ENDOCRINE: No polyuria, nocturia,  HEMATOLOGY: No anemia, easy bruising or bleeding SKIN: No rash or lesion. MUSCULOSKELETAL: No joint pain or arthritis.   NEUROLOGIC: No tingling, numbness, weakness.  PSYCHIATRY: No anxiety or depression.   ROS  DRUG ALLERGIES:   Allergies  Allergen Reactions  . Other Other (See Comments)    Combative with opioid medications    VITALS:  Blood pressure (!) 160/64, pulse 65, temperature 98.4 F (36.9 C), temperature source Oral, resp. rate 16, height 6' (1.829 m), weight 91.9 kg, SpO2 98 %.  PHYSICAL EXAMINATION:  GENERAL:  78 y.o.-year-old patient lying in the bed with no acute distress.  EYES: Pupils equal, round, reactive to light and accommodation. No scleral icterus. Extraocular muscles intact.  HEENT: Head atraumatic, normocephalic. Oropharynx and nasopharynx clear.  NECK:  Supple, no jugular venous distention. No thyroid enlargement, no tenderness.  LUNGS: Normal breath sounds bilaterally, no wheezing, rales,rhonchi or crepitation. No use of accessory muscles of respiration.  CARDIOVASCULAR: S1, S2 normal. No murmurs, rubs, or gallops.  ABDOMEN: Soft, nontender, nondistended. Bowel sounds present. No organomegaly or mass.   EXTREMITIES: No pedal edema, cyanosis, or clubbing.  NEUROLOGIC: Cranial nerves II through XII are intact. Muscle strength 4/5 in all extremities-somewhat decreased in the right side but able to move and follows,. Sensation intact. Gait not checked.  PSYCHIATRIC: The patient is alert and oriented x 3.  SKIN: No obvious rash, lesion, or ulcer.   Physical Exam LABORATORY PANEL:   CBC Recent Labs  Lab 02/12/19 0713  WBC 6.2  HGB 11.8*  HCT 37.1*  PLT 181   ------------------------------------------------------------------------------------------------------------------  Chemistries  Recent Labs  Lab 02/11/19 1127 02/12/19 0713  NA 136 138  K 3.6 3.7  CL 94* 96*  CO2 29 28  GLUCOSE 107* 116*  BUN 25* 33*  CREATININE 7.57* 9.29*  CALCIUM 9.1 9.0  AST 18  --   ALT 15  --   ALKPHOS 58  --   BILITOT 0.8  --    ------------------------------------------------------------------------------------------------------------------  Cardiac Enzymes No results for input(s): TROPONINI in the last 168 hours. ------------------------------------------------------------------------------------------------------------------  RADIOLOGY:  Ct Head Wo Contrast  Result Date: 02/11/2019 CLINICAL DATA:  Altered level of consciousness. EXAM: CT HEAD WITHOUT CONTRAST TECHNIQUE: Contiguous axial images were obtained from the base of the skull through the vertex without intravenous contrast. COMPARISON:  None. FINDINGS: Brain: Atrophy with sulcal prominence centralized volume loss with commensurate ex vacuo dilatation of the ventricular system. Rather extensive periventricular hypodensities compatible with microvascular ischemic disease. 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 Bilateral basal ganglial calcifications. Given extensive background parenchymal abnormalities, there is no CT evidence of superimposed acute large territory infarct. No intraparenchymal or extra-axial mass  or hemorrhage. Normal configuration of the ventricles and the basilar cisterns.  No midline shift. Vascular: Intracranial atherosclerosis. Skull: No displaced calvarial fracture. Sinuses/Orbits: There is underpneumatization of the bilateral frontal sinuses. Small air-fluid level noted within the right sphenoid sinus. The remaining paranasal sinuses and mastoid air cells are normally aerated. Post bilateral cataract surgery with additional postoperative change of the left lobe. Other: Regional soft tissues appear normal. IMPRESSION: Advanced atrophy and microvascular ischemic disease without superimposed acute intracranial process. Electronically Signed   By: Sandi Mariscal M.D.   On: 02/11/2019 12:59   Mr Brain Wo Contrast  Result Date: 02/11/2019 CLINICAL DATA:  Altered mental status. Recent dialysis. Hypertensive. Confusion. EXAM: MRI HEAD WITHOUT CONTRAST TECHNIQUE: Multiplanar, multiecho pulse sequences of the brain and surrounding structures were obtained without intravenous contrast. COMPARISON:  CT head earlier in the day. FINDINGS: Brain: No evidence for acute infarction, hemorrhage, mass lesion, hydrocephalus, or extra-axial fluid. Generalized atrophy. Advanced T2 and FLAIR hyperintensities throughout the white matter, consistent with chronic microvascular ischemic change. Prominent perivascular spaces suggesting longstanding hypertension. 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 Vascular: Flow voids are maintained throughout the carotid, basilar, and vertebral arteries. There are no areas of chronic hemorrhage. Skull and upper cervical spine: Unremarkable visualized calvarium, skullbase, and cervical vertebrae. Pituitary, pineal, cerebellar tonsils unremarkable. No upper cervical cord lesions. Sinuses/Orbits: No orbital masses or proptosis. Globes appear symmetric. BILATERAL cataract extraction. Sinuses appear well aerated,  except for trace layering sphenoid fluid. Other: None. IMPRESSION: Atrophy and small vessel disease. No acute intracranial findings. Electronically Signed   By: Staci Righter M.D.   On: 02/11/2019 18:22   US Carotid Bilateral (at [facility] Only)  Result Date: 02/11/2019 CLINICAL DATA:  Right-sided weakness EXAM: BILATERAL CAROTID DUPLEX ULTRASOUND TECHNIQUE: Pearline Cables scale imaging, color Doppler and duplex ultrasound were performed of bilateral carotid and vertebral arteries in the neck. COMPARISON:  None. FINDINGS: Criteria: Quantification of carotid stenosis is based on velocity parameters that correlate the residual internal carotid diameter with NASCET-based stenosis levels, using the diameter of the distal internal carotid lumen as the denominator for stenosis measurement. The following velocity measurements were obtained: RIGHT ICA: 105 cm/sec CCA: 90 cm/sec SYSTOLIC ICA/CCA RATIO:  1.2 ECA: 348 cm/sec LEFT ICA: 138 cm/sec CCA: [id number] cm/sec SYSTOLIC ICA/CCA RATIO:  1.2 ECA: 2 318 cm/sec RIGHT CAROTID ARTERY: There is mild intimal thickening of the right CCA without evidence for a high-grade stenosis. There are calcified and noncalcified plaques involving the distal right CCA resulting in around 50% stenosis. There are atherosclerotic plaques involving the right carotid bulb resulting in around 50% stenosis. There is atherosclerotic plaque at the origin of the right ECA resulting in a moderate to high-grade stenosis. There is calcified plaque involving the proximal right ICA resulting in less than 50% stenosis by grayscale and color imaging. There are calcified and noncalcified plaque throughout the remaining portion of the right ICA without evidence for high-grade stenosis. RIGHT VERTEBRAL ARTERY:  Antegrade flow is noted. LEFT CAROTID ARTERY: There is some mild intimal thickening of the left CCA without evidence for high-grade stenosis. There are calcified and noncalcified plaques involving the carotid bulb  resulting in greater than 50% stenosis by grayscale and color Doppler imaging. There are atherosclerotic plaques at the origin of the left ECA resulting in a moderate to high-grade stenosis. There is calcified plaque at the origin of the left ICA resulting in greater than 50% stenosis by grayscale and color imaging. The remaining portions of the left ICA  demonstrate mild-to-moderate calcified and noncalcified plaques without evidence for a high-grade stenosis. LEFT VERTEBRAL ARTERY:  Antegrade flow is noted IMPRESSION: 1. Calcified and noncalcified plaque involving the right carotid bulb and right ICA resulting in less than 50% stenosis by both grayscale and color Doppler imaging. 2. 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 3. Elevated peak systolic velocities within both external carotid arteries likely representing a moderate to high-grade stenosis. 4. Antegrade flow within both vertebral arteries. Electronically Signed   By: Constance Holster M.D.   On: 02/11/2019 20:28   Dg Chest Port 1 View  Result Date: 02/11/2019 CLINICAL DATA:  Altered mental status. EXAM: PORTABLE CHEST 1 VIEW COMPARISON:  None. FINDINGS: Normal sized heart. Tortuous and calcified thoracic aorta. Clear lungs. Right axillary stent. Unremarkable bones. IMPRESSION: No acute abnormality. Electronically Signed   By: Claudie Revering M.D.   On: 02/11/2019 11:48    ASSESSMENT AND PLAN:   Active Problems:   Right sided weakness  1.  Right-sided weakness starting  after dialysis.  CT scan of the head negative.   MRI brain is negative.  Carotid Doppler study did not show significant acute blockages.  Has some 50 to 69% blockages on left ICA could this be a TIA secondary to that? LDL is 95, on atorvastatin 40 mg. Continue aspirin.   Swallow screen.  Physical therapy occupational therapy evaluations.  Monitor on telemetry. Appreciate neurology consult.  2.  Fever with normal white count.  Urine analysis does look positive   Follow-up blood  cultures and urine cultures.  Chest x-ray negative.     Continue the Rocephin started in the ED.  COVID-19 negative.  3.  End-stage renal disease on dialysis Monday Wednesday Friday.  Appreciated Consult nephrology.  4.  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  Decrease dose of Lantus down to 8 units and sliding scale.  Checked a hemoglobin A1c-6.9.  5.  Essential hypertension.  Continue usual medications. Allow permissive hypertension on first day and started on amlodipine.     All the records are reviewed and case discussed with Care Management/Social Workerr. Management plans discussed with the patient, family and they are in agreement.  CODE STATUS: Full  TOTAL TIME TAKING CARE OF THIS PATIENT: 35 minutes.     POSSIBLE D/C IN 1-2 DAYS, DEPENDING ON CLINICAL CONDITION.   Vaughan Basta M.D on 02/12/2019   Between 7am to 6pm - Pager - [phone]  After 6pm go to [email or web address] - password [email or web address] [place] [facility]  Office  [phone]  CC: Primary care physician; Delilah Shan, MD  Note: This dictation was prepared with Dragon dictation along with smaller phrase technology. Any transcriptional errors that result from this process are unintentional.

## 2019-02-12 NOTE — Consult Note (Signed)
Reason for Consult: AMS Referring Physician: Hospitalist  CC: AMS  HPI: Gregory Davenport is an 78 y.o. 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 My HPI mostly per provider notes.   " Daughter reports that the patient received dialysis yesterday and after she picked him up he was quite weak and a little bit out of it.  She notes that sometimes that happens after dialysis however his symptoms have continued until today.  She reports at baseline he is able to ambulate without difficulty.  However today she and her whole family had significant difficulty getting him out of bed.  She notes that he is more confused than typical.  She also reports a fever of 100.9 at home.  Patient apparently admitted to [facility] 2 weeks ago for urinary tract infection"    " Patient coming in feeling miserable.  Since dialysis yesterday has been feeling weak and not feeling well at all.  The daughter had noticed a fever at home and some confusion.  Patient had right-sided weakness and was unable to transfer this morning.  In the ER he did have some right-sided weakness and can hardly lift his right leg up off the bed.  He was thought to have a urinary infection."  Patient evaluated by Teleneurology:  NIHS 1. " discussed the case in detail with the patient. Also discussed the case with the staff. He has some right leg weakness. His MRIs and CTs have been negative. He still could have a small stroke that was not seen on the MRI. I would recommend treating him as a possible stroke along with UTI. He should get physical therapy, occupational therapy eval's. He should follow-up with neurology. Depending on his progress, will plan further care"   Head ct on 10/10: Advanced atrophy and microvascular ischemic disease without superimposed acute intracranial process.  MRI on 10/10: No evidence for acute infarction,  hemorrhage, mass lesion, hydrocephalus, or extra-axial fluid. Generalized atrophy. Advanced T2 and FLAIR hyperintensities throughout the white matter, consistent with chronic microvascular ischemic change. Prominent perivascular spaces suggesting longstanding hypertension. 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  CXR and labs per EMR. Patient found to have UTI and started on ATBx     Past Medical History:  Diagnosis Date  . CAD (coronary artery disease)   . Diabetes mellitus without complication ([place])   . Dialysis patient ([place])   . End stage renal disease ([place])   . Heart attack ([place])   . Hypertension     Past Surgical History:  Procedure Laterality Date  . EYE SURGERY    . fistula right arm      Family History  Problem Relation Age of Onset  . Dementia Mother   . Hypertension Mother   . Diabetes Sister   . Kidney failure Sister     Social History:  reports that he has quit smoking. He has never used smokeless tobacco. He reports that he does not drink alcohol or use drugs.  Allergies  Allergen Reactions  . Other Other (See Comments)    Combative with opioid medications    Medications: I have reviewed the patient's current medications.  ROS: As per HPI Physical Examination: Blood pressure (!) 160/64, pulse 65, temperature 98.4 F (36.9 C), temperature source Oral, resp. rate 16, height 6' (1.829 m), weight 91.9 kg,  SpO2 98 %.  Neurologic Examination Alert, awake, speech nle, follows commands Patient is legally blind. PERLA, EOM seems intact, VFF not performed, no nystagmus, flattening of right naso-labial fold, face sensation seems nle, uvula and tongue midline Motor: 5/5 in LUEX/RLEX, he is 4/5 RUEX/RLEX No sensory deficit appreciated ( though difficult to conduct) No coordination deficit appreciated DTR and gait not checked at the time of examination.  Results for orders  placed or performed during the hospital encounter of 02/11/19 (from the past 48 hour(s))  Lactic acid, plasma     Status: None   Collection Time: 02/11/19 11:27 AM  Result Value Ref Range   Lactic Acid, Venous 0.9 0.5 - 1.9 mmol/L    Comment: Performed at [facility], [address] [place], [place] [address]  Comprehensive metabolic panel     Status: Abnormal   Collection Time: 02/11/19 11:27 AM  Result Value Ref Range   Sodium 136 135 - 145 mmol/L   Potassium 3.6 3.5 - 5.1 mmol/L   Chloride 94 (L) 98 - 111 mmol/L   CO2 29 22 - 32 mmol/L   Glucose, Bld 107 (H) 70 - 99 mg/dL   BUN 25 (H) 8 - 23 mg/dL   Creatinine, Ser 7.57 (H) 0.61 - 1.24 mg/dL   Calcium 9.1 8.9 - 10.3 mg/dL   Total Protein 7.3 6.5 - 8.1 g/dL   Albumin 3.6 3.5 - 5.0 g/dL   AST 18 15 - 41 U/L   ALT 15 0 - 44 U/L   Alkaline Phosphatase 58 38 - 126 U/L   Total Bilirubin 0.8 0.3 - 1.2 mg/dL   GFR calc non Af Amer 6 (L) >60 mL/min   GFR calc Af Amer 7 (L) >60 mL/min   Anion gap 13 5 - 15    Comment: Performed at [facility], [address] [place], [place] [address]  CBC WITH DIFFERENTIAL     Status: Abnormal   Collection Time: 02/11/19 11:27 AM  Result Value Ref Range   WBC 7.4 4.0 - 10.5 K/uL   RBC 3.57 (L) 4.22 - 5.81 MIL/uL   Hemoglobin 11.2 (L) 13.0 - 17.0 g/dL   HCT 35.8 (L) 39.0 - 52.0 %   MCV 100.3 (H) 80.0 - 100.0 fL   MCH 31.4 26.0 - 34.0 pg   MCHC 31.3 30.0 - 36.0 g/dL   RDW 14.5 11.5 - 15.5 %   Platelets 168 150 - 400 K/uL   nRBC 0.0 0.0 - 0.2 %   Neutrophils Relative % 54 %   Neutro Abs 4.0 1.7 - 7.7 K/uL   Lymphocytes Relative 29 %   Lymphs Abs 2.1 0.7 - 4.0 K/uL   Monocytes Relative 15 %   Monocytes Absolute 1.1 (H) 0.1 - 1.0 K/uL   Eosinophils Relative 2 %   Eosinophils Absolute 0.1 0.0 - 0.5 K/uL   Basophils Relative 0 %   Basophils Absolute 0.0 0.0 - 0.1 K/uL   Immature Granulocytes 0 %   Abs Immature Granulocytes 0.02 0.00 - 0.07 K/uL    Comment: Performed at  [facility], [address] [place], [place] [address]  APTT     Status: None   Collection Time: 02/11/19 11:27 AM  Result Value Ref Range   aPTT 32 24 - 36 seconds    Comment: Performed at [place], [address] [place], [place] [address]  Protime-INR     Status: None   Collection Time: 02/11/19 11:27 AM  Result Value Ref Range   Prothrombin Time 13.1 11.4 - 15.2 seconds   INR 1.0 0.8 - 1.2    Comment: (NOTE) INR goal varies based on device and disease states. Performed at [facility], [address], [place], [place] [address]   Blood Culture (routine x 2)     Status: None (Preliminary result)   Collection Time: 02/11/19 11:27 AM   Specimen: BLOOD  Result Value Ref Range   Specimen Description BLOOD BLOOD LEFT HAND    Special Requests      BOTTLES DRAWN AEROBIC AND ANAEROBIC Blood Culture adequate volume   Culture      NO GROWTH < 24 HOURS Performed at [facility], [address], [place], [place] [address]    Report Status PENDING   Procalcitonin     Status: None   Collection Time: 02/11/19 11:27 AM  Result Value Ref Range   Procalcitonin 0.99 ng/mL    Comment:        Interpretation: PCT > 0.5 ng/mL and <= 2 ng/mL: Systemic infection (sepsis) is possible, but other conditions are known to elevate PCT as well. (NOTE)       Sepsis PCT Algorithm           Lower Respiratory Tract                                      Infection PCT Algorithm    ----------------------------     ----------------------------         PCT < 0.25 ng/mL                PCT < 0.10 ng/mL         Strongly encourage             Strongly discourage   discontinuation of antibiotics    initiation of antibiotics    ----------------------------     -----------------------------       PCT 0.25 - 0.50 ng/mL            PCT 0.10 - 0.25 ng/mL               OR       >80% decrease in PCT            Discourage initiation of                                             antibiotics      Encourage discontinuation           of antibiotics    ----------------------------     -----------------------------         PCT >= 0.50 ng/mL              PCT 0.26 - 0.50 ng/mL                AND       <80% decrease in PCT             Encourage initiation of                                             antibiotics  Encourage continuation           of antibiotics    ----------------------------     -----------------------------        PCT >= 0.50 ng/mL                  PCT > 0.50 ng/mL               AND         increase in PCT                  Strongly encourage                                      initiation of antibiotics    Strongly encourage escalation           of antibiotics                                     -----------------------------                                           PCT <= 0.25 ng/mL                                                 OR                                        > 80% decrease in PCT                                     Discontinue / Do not initiate                                             antibiotics Performed at [facility], [address] [place], [place] [address]   Hemoglobin A1c     Status: Abnormal   Collection Time: 02/11/19 11:27 AM  Result Value Ref Range   Hgb A1c MFr Bld 6.9 (H) 4.8 - 5.6 %    Comment: (NOTE) Pre diabetes:          5.7%-6.4% Diabetes:              >6.4% Glycemic control for   <7.0% adults with diabetes    Mean Plasma Glucose 151.33 mg/dL    Comment: Performed at [facility] [address], [place], [place] [address]  Blood Culture (routine x 2)     Status: None (Preliminary result)   Collection Time: 02/11/19 11:28 AM   Specimen: BLOOD  Result Value Ref Range   Specimen Description BLOOD BLOOD LEFT ARM    Special Requests      BOTTLES DRAWN AEROBIC AND ANAEROBIC Blood Culture adequate volume   Culture      NO GROWTH <  24 HOURS Performed at [facility],  [address] [place], [place] [address]    Report Status PENDING   Lactic acid, plasma     Status: None   Collection Time: 02/11/19  1:10 PM  Result Value Ref Range   Lactic Acid, Venous 0.9 0.5 - 1.9 mmol/L    Comment: Performed at [facility], [address] [place], [place] [address]  Urinalysis, Complete w Microscopic     Status: Abnormal   Collection Time: 02/11/19  1:10 PM  Result Value Ref Range   Color, Urine AMBER (A) YELLOW    Comment: BIOCHEMICALS MAY BE AFFECTED BY COLOR   APPearance CLOUDY (A) CLEAR   Specific Gravity, Urine 1.013 1.005 - 1.030   pH 6.0 5.0 - 8.0   Glucose, UA NEGATIVE NEGATIVE mg/dL   Hgb urine dipstick MODERATE (A) NEGATIVE   Bilirubin Urine NEGATIVE NEGATIVE   Ketones, ur NEGATIVE NEGATIVE mg/dL   Protein, ur 100 (A) NEGATIVE mg/dL   Nitrite NEGATIVE NEGATIVE   Leukocytes,Ua LARGE (A) NEGATIVE   RBC / HPF 21-50 0 - 5 RBC/hpf   WBC, UA >50 (H) 0 - 5 WBC/hpf   Bacteria, UA MANY (A) NONE SEEN   Squamous Epithelial / LPF 6-10 0 - 5   WBC Clumps PRESENT     Comment: Performed at [facility], [address] [place], [place] [address]  SARS Coronavirus 2 by RT PCR (hospital order, performed in [place] hospital lab) Nasopharyngeal Nasopharyngeal Swab     Status: None   Collection Time: 02/11/19  1:10 PM   Specimen: Nasopharyngeal Swab  Result Value Ref Range   SARS Coronavirus 2 NEGATIVE NEGATIVE    Comment: (NOTE) If result is NEGATIVE SARS-CoV-2 target nucleic acids are NOT DETECTED. The SARS-CoV-2 RNA is generally detectable in upper and lower  respiratory specimens during the acute phase of infection. The lowest  concentration of SARS-CoV-2 viral copies this assay can detect is 250  copies / mL. A negative result does not preclude SARS-CoV-2 infection  and should not be used as the sole basis for treatment or other  patient management decisions.  A negative result may occur with  improper specimen collection /  handling, submission of specimen other  than nasopharyngeal swab, presence of viral mutation(s) within the  areas targeted by this assay, and inadequate number of viral copies  (<250 copies / mL). A negative result must be combined with clinical  observations, patient history, and epidemiological information. If result is POSITIVE SARS-CoV-2 target nucleic acids are DETECTED. The SARS-CoV-2 RNA is generally detectable in upper and lower  respiratory specimens dur ing the acute phase of infection.  Positive  results are indicative of active infection with SARS-CoV-2.  Clinical  correlation with patient history and other diagnostic information is  necessary to determine patient infection status.  Positive results do  not rule out bacterial infection or co-infection with other viruses. If result is PRESUMPTIVE POSTIVE SARS-CoV-2 nucleic acids MAY BE PRESENT.   A presumptive positive result was obtained on the submitted specimen  and confirmed on repeat testing.  While 2019 novel coronavirus  (SARS-CoV-2) nucleic acids may be present in the submitted sample  additional confirmatory testing may be necessary for epidemiological  and / or clinical management purposes  to differentiate between  SARS-CoV-2 and other Sarbecovirus currently known to infect humans.  If clinically indicated additional testing with an alternate test  methodology [phone]) is advised. The SARS-CoV-2 RNA is generally  detectable in upper and lower respiratory sp ecimens during the acute  phase of infection. The expected result is Negative. Fact Sheet for Patients:  StrictlyIdeas.no Fact Sheet for Healthcare Providers: BankingDealers.co.za This test is not yet approved or cleared by the [place] FDA and has been authorized for detection and/or diagnosis of SARS-CoV-2 by FDA under an Emergency Use Authorization (EUA).  This EUA will remain in effect (meaning this  test can be used) for the duration of the COVID-19 declaration under Section 564(b)(1) of the Act, 21 U.S.C. section 360bbb-3(b)(1), unless the authorization is terminated or revoked sooner. Performed at [facility], [address] [place], [place] [address]   Glucose, capillary     Status: Abnormal   Collection Time: 02/11/19  8:52 PM  Result Value Ref Range   Glucose-Capillary 191 (H) 70 - 99 mg/dL  CBC     Status: Abnormal   Collection Time: 02/12/19  7:13 AM  Result Value Ref Range   WBC 6.2 4.0 - 10.5 K/uL   RBC 3.72 (L) 4.22 - 5.81 MIL/uL   Hemoglobin 11.8 (L) 13.0 - 17.0 g/dL   HCT 37.1 (L) 39.0 - 52.0 %   MCV 99.7 80.0 - 100.0 fL   MCH 31.7 26.0 - 34.0 pg   MCHC 31.8 30.0 - 36.0 g/dL   RDW 14.3 11.5 - 15.5 %   Platelets 181 150 - 400 K/uL   nRBC 0.0 0.0 - 0.2 %    Comment: Performed at [facility], [address] [place], [place] [id number]  Basic metabolic panel     Status: Abnormal   Collection Time: 02/12/19  7:13 AM  Result Value Ref Range   Sodium 138 135 - 145 mmol/L   Potassium 3.7 3.5 - 5.1 mmol/L   Chloride 96 (L) 98 - 111 mmol/L   CO2 28 22 - 32 mmol/L   Glucose, Bld 116 (H) 70 - 99 mg/dL   BUN 33 (H) 8 - 23 mg/dL   Creatinine, Ser 9.29 (H) 0.61 - 1.24 mg/dL   Calcium 9.0 8.9 - 10.3 mg/dL   GFR calc non Af Amer 5 (L) >60 mL/min   GFR calc Af Amer 6 (L) >60 mL/min   Anion gap 14 5 - 15    Comment: Performed at [facility], [address] [place], [place] [address]  Lipid panel     Status: Abnormal   Collection Time: 02/12/19  7:13 AM  Result Value Ref Range   Cholesterol 151 0 - 200 mg/dL   Triglycerides 123 <150 mg/dL   HDL 31 (L) >40 mg/dL   Total CHOL/HDL Ratio 4.9 RATIO   VLDL 25 0 - 40 mg/dL   LDL Cholesterol 95 0 - 99 mg/dL    Comment:        Total Cholesterol/HDL:CHD Risk Coronary Heart Disease Risk Table                     Men   Women  1/2 Average Risk   3.4   3.3  Average Risk       5.0   4.4  2 X  Average Risk   9.6   7.1  3 X Average Risk  23.4   11.0        Use the calculated Patient Ratio above and the CHD Risk Table to determine the patient's CHD Risk.        ATP III CLASSIFICATION (LDL):  <100  mg/dL   Optimal  100-129  mg/dL   Near or Above                    Optimal  130-159  mg/dL   Borderline  160-189  mg/dL   High  >190     mg/dL   Very High Performed at [facility], [address] [place], [place] [address]   Ammonia     Status: None   Collection Time: 02/12/19  7:13 AM  Result Value Ref Range   Ammonia 25 9 - 35 umol/L    Comment: Performed at [facility], [address] [place], [place] [address]  Glucose, capillary     Status: None   Collection Time: 02/12/19  7:53 AM  Result Value Ref Range   Glucose-Capillary 99 70 - 99 mg/dL    Recent Results (from the past 240 hour(s))  Blood Culture (routine x 2)     Status: None (Preliminary result)   Collection Time: 02/11/19 11:27 AM   Specimen: BLOOD  Result Value Ref Range Status   Specimen Description BLOOD BLOOD LEFT HAND  Final   Special Requests   Final    BOTTLES DRAWN AEROBIC AND ANAEROBIC Blood Culture adequate volume   Culture   Final    NO GROWTH < 24 HOURS Performed at [facility], [address], [place], [place] [address]    Report Status PENDING  Incomplete  Blood Culture (routine x 2)     Status: None (Preliminary result)   Collection Time: 02/11/19 11:28 AM   Specimen: BLOOD  Result Value Ref Range Status   Specimen Description BLOOD BLOOD LEFT ARM  Final   Special Requests   Final    BOTTLES DRAWN AEROBIC AND ANAEROBIC Blood Culture adequate volume   Culture   Final    NO GROWTH < 24 HOURS Performed at [facility], [address], [place], [place] [address]    Report Status PENDING  Incomplete  SARS Coronavirus 2 by RT PCR (hospital order, performed in [place] hospital lab) Nasopharyngeal Nasopharyngeal Swab     Status: None    Collection Time: 02/11/19  1:10 PM   Specimen: Nasopharyngeal Swab  Result Value Ref Range Status   SARS Coronavirus 2 NEGATIVE NEGATIVE Final    Comment: (NOTE) If result is NEGATIVE SARS-CoV-2 target nucleic acids are NOT DETECTED. The SARS-CoV-2 RNA is generally detectable in upper and lower  respiratory specimens during the acute phase of infection. The lowest  concentration of SARS-CoV-2 viral copies this assay can detect is 250  copies / mL. A negative result does not preclude SARS-CoV-2 infection  and should not be used as the sole basis for treatment or other  patient management decisions.  A negative result may occur with  improper specimen collection / handling, submission of specimen other  than nasopharyngeal swab, presence of viral mutation(s) within the  areas targeted by this assay, and inadequate number of viral copies  (<250 copies / mL). A negative result must be combined with clinical  observations, patient history, and epidemiological information. If result is POSITIVE SARS-CoV-2 target nucleic acids are DETECTED. The SARS-CoV-2 RNA is generally detectable in upper and lower  respiratory specimens dur ing the acute phase of infection.  Positive  results are indicative of active infection with SARS-CoV-2.  Clinical  correlation with patient history and other diagnostic information is  necessary to determine patient infection status.  Positive results do  not  rule out bacterial infection or co-infection with other viruses. If result is PRESUMPTIVE POSTIVE SARS-CoV-2 nucleic acids MAY BE PRESENT.   A presumptive positive result was obtained on the submitted specimen  and confirmed on repeat testing.  While 2019 novel coronavirus  (SARS-CoV-2) nucleic acids may be present in the submitted sample  additional confirmatory testing may be necessary for epidemiological  and / or clinical management purposes  to differentiate between  SARS-CoV-2 and other Sarbecovirus  currently known to infect humans.  If clinically indicated additional testing with an alternate test  methodology [phone]) is advised. The SARS-CoV-2 RNA is generally  detectable in upper and lower respiratory sp ecimens during the acute  phase of infection. The expected result is Negative. Fact Sheet for Patients:  StrictlyIdeas.no Fact Sheet for Healthcare Providers: BankingDealers.co.za This test is not yet approved or cleared by the [place] FDA and has been authorized for detection and/or diagnosis of SARS-CoV-2 by FDA under an Emergency Use Authorization (EUA).  This EUA will remain in effect (meaning this test can be used) for the duration of the COVID-19 declaration under Section 564(b)(1) of the Act, 21 U.S.C. section 360bbb-3(b)(1), unless the authorization is terminated or revoked sooner. Performed at [facility], [place], [place] [address]     Ct Head Wo Contrast  Result Date: 02/11/2019 CLINICAL DATA:  Altered level of consciousness. EXAM: CT HEAD WITHOUT CONTRAST TECHNIQUE: Contiguous axial images were obtained from the base of the skull through the vertex without intravenous contrast. COMPARISON:  None. FINDINGS: Brain: Atrophy with sulcal prominence centralized volume loss with commensurate ex vacuo dilatation of the ventricular system. Rather extensive periventricular hypodensities compatible with microvascular ischemic disease. 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 Bilateral basal ganglial calcifications. Given extensive background parenchymal abnormalities, there is no CT evidence of superimposed acute large territory infarct. No intraparenchymal or extra-axial mass or hemorrhage. Normal configuration of the ventricles and the basilar cisterns. No midline shift. Vascular: Intracranial atherosclerosis. Skull: No displaced calvarial fracture. Sinuses/Orbits: There is  underpneumatization of the bilateral frontal sinuses. Small air-fluid level noted within the right sphenoid sinus. The remaining paranasal sinuses and mastoid air cells are normally aerated. Post bilateral cataract surgery with additional postoperative change of the left lobe. Other: Regional soft tissues appear normal. IMPRESSION: Advanced atrophy and microvascular ischemic disease without superimposed acute intracranial process. Electronically Signed   By: Sandi Mariscal M.D.   On: 02/11/2019 12:59   Mr Brain Wo Contrast  Result Date: 02/11/2019 CLINICAL DATA:  Altered mental status. Recent dialysis. Hypertensive. Confusion. EXAM: MRI HEAD WITHOUT CONTRAST TECHNIQUE: Multiplanar, multiecho pulse sequences of the brain and surrounding structures were obtained without intravenous contrast. COMPARISON:  CT head earlier in the day. FINDINGS: Brain: No evidence for acute infarction, hemorrhage, mass lesion, hydrocephalus, or extra-axial fluid. Generalized atrophy. Advanced T2 and FLAIR hyperintensities throughout the white matter, consistent with chronic microvascular ischemic change. Prominent perivascular spaces suggesting longstanding hypertension. 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 Vascular: Flow voids are maintained throughout the carotid, basilar, and vertebral arteries. There are no areas of chronic hemorrhage. Skull and upper cervical spine: Unremarkable visualized calvarium, skullbase, and cervical vertebrae. Pituitary, pineal, cerebellar tonsils unremarkable. No upper cervical cord lesions. Sinuses/Orbits: No orbital masses or proptosis. Globes appear symmetric. BILATERAL cataract extraction. Sinuses appear well aerated, except for trace layering sphenoid fluid. Other: None. IMPRESSION: Atrophy and small vessel disease. No acute intracranial findings.  Electronically Signed   By: Staci Righter M.D.   On: 02/11/2019 18:22    US Carotid Bilateral (at [facility] Only)  Result Date: 02/11/2019 CLINICAL DATA:  Right-sided weakness EXAM: BILATERAL CAROTID DUPLEX ULTRASOUND TECHNIQUE: Pearline Cables scale imaging, color Doppler and duplex ultrasound were performed of bilateral carotid and vertebral arteries in the neck. COMPARISON:  None. FINDINGS: Criteria: Quantification of carotid stenosis is based on velocity parameters that correlate the residual internal carotid diameter with NASCET-based stenosis levels, using the diameter of the distal internal carotid lumen as the denominator for stenosis measurement. The following velocity measurements were obtained: RIGHT ICA: 105 cm/sec CCA: 90 cm/sec SYSTOLIC ICA/CCA RATIO:  1.2 ECA: 348 cm/sec LEFT ICA: 138 cm/sec CCA: [id number] cm/sec SYSTOLIC ICA/CCA RATIO:  1.2 ECA: 2 318 cm/sec RIGHT CAROTID ARTERY: There is mild intimal thickening of the right CCA without evidence for a high-grade stenosis. There are calcified and noncalcified plaques involving the distal right CCA resulting in around 50% stenosis. There are atherosclerotic plaques involving the right carotid bulb resulting in around 50% stenosis. There is atherosclerotic plaque at the origin of the right ECA resulting in a moderate to high-grade stenosis. There is calcified plaque involving the proximal right ICA resulting in less than 50% stenosis by grayscale and color imaging. There are calcified and noncalcified plaque throughout the remaining portion of the right ICA without evidence for high-grade stenosis. RIGHT VERTEBRAL ARTERY:  Antegrade flow is noted. LEFT CAROTID ARTERY: There is some mild intimal thickening of the left CCA without evidence for high-grade stenosis. There are calcified and noncalcified plaques involving the carotid bulb resulting in greater than 50% stenosis by grayscale and color Doppler imaging. There are atherosclerotic plaques at the origin of the left ECA resulting in a moderate to high-grade stenosis. There is  calcified plaque at the origin of the left ICA resulting in greater than 50% stenosis by grayscale and color imaging. The remaining portions of the left ICA demonstrate mild-to-moderate calcified and noncalcified plaques without evidence for a high-grade stenosis. LEFT VERTEBRAL ARTERY:  Antegrade flow is noted IMPRESSION: 1. Calcified and noncalcified plaque involving the right carotid bulb and right ICA resulting in less than 50% stenosis by both grayscale and color Doppler imaging. 2. 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 3. Elevated peak systolic velocities within both external carotid arteries likely representing a moderate to high-grade stenosis. 4. Antegrade flow within both vertebral arteries. Electronically Signed   By: Constance Holster M.D.   On: 02/11/2019 20:28   Dg Chest Port 1 View  Result Date: 02/11/2019 CLINICAL DATA:  Altered mental status. EXAM: PORTABLE CHEST 1 VIEW COMPARISON:  None. FINDINGS: Normal sized heart. Tortuous and calcified thoracic aorta. Clear lungs. Right axillary stent. Unremarkable bones. IMPRESSION: No acute abnormality. Electronically Signed   By: Claudie Revering M.D.   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 althought it could be infectious, meds, electrolytes/metabolic abnlies, patient may have suffered stroke. Included in differentiel  sz with todd s.  RECS:- - Neuro protectives measures  Including normothermia, normoglycemia, correct electrolytes/metabolic abnlities, treat any infection - Stroke w/up including ECHO, US carotid. ASA/Statin if no CI - EEG. No Aeds for now until he declares himself or EEG findings precautions. Prn ativan sz. - Nephrology f/up - PT/OT - Will  f/up . 02/12/2019, 10:49 AM

## 2019-02-12 NOTE — Progress Notes (Addendum)
Pt blood sugar was at 106 and have a scheduled lantus 8 units. Notify prime. Will continue t monitor.  Update 2210: Dr. Brett Albino ordered to hold lantus tonight. Will continue to monitor.

## 2019-02-12 NOTE — Plan of Care (Signed)
  Problem: Education: Goal: Knowledge of General Education information will improve Description: Including pain rating scale, medication(s)/side effects and non-pharmacologic comfort measures Outcome: Progressing   Problem: Safety: Goal: Ability to remain free from injury will improve Outcome: Progressing   Problem: Education: Goal: Knowledge of secondary prevention will improve Outcome: Progressing

## 2019-02-12 NOTE — Consult Note (Signed)
[address] [place], [place] [address] Phone [phone]. Fax [phone]  Date: 02/12/2019                  Patient Name:  Gregory Davenport  MRN: [id number]  DOB: Sep 18, 1940  Age / Sex: 78 y.o., male         PCP: Delilah Shan, MD                 Service Requesting Consult: IM/ Vaughan Basta, *                 Reason for Consult: ESRD            History of Present Illness: Patient is a 78 y.o. male with medical problems of ESRD, who was admitted to [facility] on 02/11/2019 for evaluation of Altered mental status. Was disoriented in triage.  generalized weakness and low grade fever at home  This morning he feels like he is back to baseline.  He states he had right-sided weakness yesterday but it is resolved now.  He is visually impaired. Followed by [facility] nephrology.  Undergoes routine hemodialysis at [facility] dialysis.  States he is on Monday Wednesday and Friday shift.  Last dialyzed on Friday.  Denies any problem during dialysis.   Medications: Outpatient medications: Medications Prior to Admission  Medication Sig Dispense Refill Last Dose  . amLODipine (NORVASC) 10 MG tablet Take 10 mg by mouth daily.   02/10/2019 at 0800  . atorvastatin (LIPITOR) 40 MG tablet Take 40 mg by mouth daily.   02/10/2019 at Unknown time  . brimonidine (ALPHAGAN) 0.2 % ophthalmic solution Place 1 drop into the right eye 2 (two) times daily.   02/10/2019 at 1900  . carvedilol (COREG) 12.5 MG tablet Take 12.5 mg by mouth 2 (two) times daily.   02/10/2019 at 1900  . dorzolamide-timolol (COSOPT) 22.3-6.8 MG/ML ophthalmic solution Place 1 drop into both eyes 2 (two) times daily.   02/10/2019 at 1900  . gabapentin (NEURONTIN) 100 MG capsule Take 100 mg by mouth at bedtime.   02/10/2019 at 2000  . insulin glargine (LANTUS) 100 UNIT/ML injection Inject 13 Units into the skin at bedtime.   02/10/2019 at 2000  . pantoprazole (PROTONIX) 40 MG tablet Take 40 mg by mouth daily.    02/10/2019 at 0800  . tamsulosin (FLOMAX) 0.4 MG CAPS capsule Take 0.4 mg by mouth daily.   02/10/2019 at 0800    Current medications: Current Facility-Administered Medications  Medication Dose Route Frequency Provider Last Rate Last Dose  .  stroke: mapping our early stages of recovery book   Does not apply Once Loletha Grayer, MD      . 0.9 %  sodium chloride infusion   Intravenous PRN Loletha Grayer, MD 10 mL/hr at 02/11/19 2244 10 mL at 02/11/19 2244  . acetaminophen (TYLENOL) tablet 650 mg  650 mg Oral Q6H PRN Lang Snow, NP   650 mg at 02/11/19 2250  . amLODipine (NORVASC) tablet 10 mg  10 mg Oral Daily Wieting, Richard, MD      . aspirin EC tablet 81 mg  81 mg Oral Daily Wieting, Richard, MD      . atorvastatin (LIPITOR) tablet 40 mg  40 mg Oral q1800 Wieting, Richard, MD      . brimonidine (ALPHAGAN) 0.2 % ophthalmic solution 1 drop  1 drop Right Eye BID Loletha Grayer, MD   1 drop at 02/11/19 2247  . carvedilol (COREG) tablet 12.5  mg  12.5 mg Oral BID Loletha Grayer, MD   12.5 mg at 02/12/19 [id number]  . cefTRIAXone (ROCEPHIN) 1 g in sodium chloride 0.9 % 100 mL IVPB  1 g Intravenous Q24H Wieting, Richard, MD      . dorzolamide-timolol (COSOPT) 22.3-6.8 MG/ML ophthalmic solution 1 drop  1 drop Both Eyes BID Loletha Grayer, MD   1 drop at 02/11/19 2247  . gabapentin (NEURONTIN) capsule 100 mg  100 mg Oral QHS Wieting, Richard, MD   100 mg at 02/11/19 2246  . heparin injection 5,000 Units  5,000 Units Subcutaneous Q8H Loletha Grayer, MD   5,000 Units at 02/12/19 [phone]  . insulin aspart (novoLOG) injection 0-5 Units  0-5 Units Subcutaneous QHS Wieting, Richard, MD      . insulin aspart (novoLOG) injection 0-9 Units  0-9 Units Subcutaneous TID WC Wieting, Richard, MD      . insulin glargine (LANTUS) injection 8 Units  8 Units Subcutaneous QHS Loletha Grayer, MD   8 Units at 02/11/19 2246  . pantoprazole (PROTONIX) EC tablet 40 mg  40 mg Oral Daily Wieting, Richard, MD       . tamsulosin (FLOMAX) capsule 0.4 mg  0.4 mg Oral Daily Loletha Grayer, MD          Allergies: Allergies  Allergen Reactions  . Other Other (See Comments)    Combative with opioid medications      Past Medical History: Past Medical History:  Diagnosis Date  . CAD (coronary artery disease)   . Diabetes mellitus without complication ([place])   . Dialysis patient ([place])   . End stage renal disease ([place])   . Heart attack ([place])   . Hypertension      Past Surgical History: Past Surgical History:  Procedure Laterality Date  . EYE SURGERY    . fistula right arm       Family History: Family History  Problem Relation Age of Onset  . Dementia Mother   . Hypertension Mother   . Diabetes Sister   . Kidney failure Sister      Social History: Social History   Socioeconomic History  . Marital status: Widowed    Spouse name: Not on file  . Number of children: Not on file  . Years of education: Not on file  . Highest education level: Not on file  Occupational History  . Not on file  Social Needs  . Financial resource strain: Not on file  . Food insecurity    Worry: Not on file    Inability: Not on file  . Transportation needs    Medical: Not on file    Non-medical: Not on file  Tobacco Use  . Smoking status: Former Research scientist (life sciences)  . Smokeless tobacco: Never Used  Substance and Sexual Activity  . Alcohol use: Never    Frequency: Never  . Drug use: Never  . Sexual activity: Not on file  Lifestyle  . Physical activity    Days per week: Not on file    Minutes per session: Not on file  . Stress: Not on file  Relationships  . Social Herbalist on phone: Not on file    Gets together: Not on file    Attends religious service: Not on file    Active member of club or organization: Not on file    Attends meetings of clubs or organizations: Not on file    Relationship status: Not on file  . Intimate partner violence  Fear of current or ex partner: Not on file     Emotionally abused: Not on file    Physically abused: Not on file    Forced sexual activity: Not on file  Other Topics Concern  . Not on file  Social History Narrative  . Not on file     Review of Systems: Gen: Denies any fevers or chills HEENT: Visually impaired.  No hearing problem CV: Denies any shortness of breath or leg edema Resp: No cough or sputum production GI: Appetite is good.  No nausea vomiting or diarrhea GU : No complaints MS: No complaints Derm:    No complaints Psych: No complaints Heme: No complaints Neuro: Right-sided weakness which is resolved now. Endocrine.  No complaints  Vital Signs: Blood pressure (!) 160/64, pulse 65, temperature 98.4 F (36.9 C), temperature source Oral, resp. rate 16, height 6' (1.829 m), weight 91.9 kg, SpO2 98 %.  No intake or output data in the 24 hours ending 02/12/19 1000  Weight trends: Filed Weights   02/11/19 1940  Weight: 91.9 kg    Physical Exam: General:  Chronically ill appearing gentleman, laying in the bed  HEENT  moist oral mucous membranes, poor vision  Neck:  Supple, no mass  Lungs:  Normal breathing effort, clear to auscultation  Heart::  Regular, no rub  Abdomen:  Soft, nontender  Extremities:  No peripheral edema  Neurologic:  Alert, able to answer questions  Skin:  Warm  Access:  Right upper arm AV graft, good bruit    Lab results: Basic Metabolic Panel: Recent Labs  Lab 02/11/19 1127 02/12/19 0713  NA 136 138  K 3.6 3.7  CL 94* 96*  CO2 29 28  GLUCOSE 107* 116*  BUN 25* 33*  CREATININE 7.57* 9.29*  CALCIUM 9.1 9.0    Liver Function Tests: Recent Labs  Lab 02/11/19 1127  AST 18  ALT 15  ALKPHOS 58  BILITOT 0.8  PROT 7.3  ALBUMIN 3.6   No results for input(s): LIPASE, AMYLASE in the last 168 hours. Recent Labs  Lab 02/12/19 0713  AMMONIA 25    CBC: Recent Labs  Lab 02/11/19 1127 02/12/19 0713  WBC 7.4 6.2  NEUTROABS 4.0  --   HGB 11.2* 11.8*  HCT 35.8* 37.1*   MCV 100.3* 99.7  PLT 168 181    Cardiac Enzymes: No results for input(s): CKTOTAL, TROPONINI in the last 168 hours.  BNP: Invalid input(s): POCBNP  CBG: Recent Labs  Lab 02/11/19 2052 02/12/19 0753  GLUCAP 191* 99    Microbiology: Recent Results (from the past 720 hour(s))  Blood Culture (routine x 2)     Status: None (Preliminary result)   Collection Time: 02/11/19 11:27 AM   Specimen: BLOOD  Result Value Ref Range Status   Specimen Description BLOOD BLOOD LEFT HAND  Final   Special Requests   Final    BOTTLES DRAWN AEROBIC AND ANAEROBIC Blood Culture adequate volume   Culture   Final    NO GROWTH < 24 HOURS Performed at [facility], [address] [place], [place] [address]    Report Status PENDING  Incomplete  Blood Culture (routine x 2)     Status: None (Preliminary result)   Collection Time: 02/11/19 11:28 AM   Specimen: BLOOD  Result Value Ref Range Status   Specimen Description BLOOD BLOOD LEFT ARM  Final   Special Requests   Final    BOTTLES DRAWN AEROBIC AND ANAEROBIC Blood Culture adequate  volume   Culture   Final    NO GROWTH < 24 HOURS Performed at [facility], [address] [place], [place] [address]    Report Status PENDING  Incomplete  SARS Coronavirus 2 by RT PCR (hospital order, performed in [facility] hospital lab) Nasopharyngeal Nasopharyngeal Swab     Status: None   Collection Time: 02/11/19  1:10 PM   Specimen: Nasopharyngeal Swab  Result Value Ref Range Status   SARS Coronavirus 2 NEGATIVE NEGATIVE Final    Comment: (NOTE) If result is NEGATIVE SARS-CoV-2 target nucleic acids are NOT DETECTED. The SARS-CoV-2 RNA is generally detectable in upper and lower  respiratory specimens during the acute phase of infection. The lowest  concentration of SARS-CoV-2 viral copies this assay can detect is 250  copies / mL. A negative result does not preclude SARS-CoV-2 infection  and should not be used as the sole basis for  treatment or other  patient management decisions.  A negative result may occur with  improper specimen collection / handling, submission of specimen other  than nasopharyngeal swab, presence of viral mutation(s) within the  areas targeted by this assay, and inadequate number of viral copies  (<250 copies / mL). A negative result must be combined with clinical  observations, patient history, and epidemiological information. If result is POSITIVE SARS-CoV-2 target nucleic acids are DETECTED. The SARS-CoV-2 RNA is generally detectable in upper and lower  respiratory specimens dur ing the acute phase of infection.  Positive  results are indicative of active infection with SARS-CoV-2.  Clinical  correlation with patient history and other diagnostic information is  necessary to determine patient infection status.  Positive results do  not rule out bacterial infection or co-infection with other viruses. If result is PRESUMPTIVE POSTIVE SARS-CoV-2 nucleic acids MAY BE PRESENT.   A presumptive positive result was obtained on the submitted specimen  and confirmed on repeat testing.  While 2019 novel coronavirus  (SARS-CoV-2) nucleic acids may be present in the submitted sample  additional confirmatory testing may be necessary for epidemiological  and / or clinical management purposes  to differentiate between  SARS-CoV-2 and other Sarbecovirus currently known to infect humans.  If clinically indicated additional testing with an alternate test  methodology [phone]) is advised. The SARS-CoV-2 RNA is generally  detectable in upper and lower respiratory sp ecimens during the acute  phase of infection. The expected result is Negative. Fact Sheet for Patients:  StrictlyIdeas.no Fact Sheet for Healthcare Providers: BankingDealers.co.za This test is not yet approved or cleared by the [place] FDA and has been authorized for detection and/or  diagnosis of SARS-CoV-2 by FDA under an Emergency Use Authorization (EUA).  This EUA will remain in effect (meaning this test can be used) for the duration of the COVID-19 declaration under Section 564(b)(1) of the Act, 21 U.S.C. section 360bbb-3(b)(1), unless the authorization is terminated or revoked sooner. Performed at [facility], [address] [place], [place] [address]      Coagulation Studies: Recent Labs    02/11/19 1127  LABPROT 13.1  INR 1.0    Urinalysis: Recent Labs    02/11/19 1310  COLORURINE AMBER*  LABSPEC 1.013  PHURINE 6.0  GLUCOSEU NEGATIVE  HGBUR MODERATE*  BILIRUBINUR NEGATIVE  KETONESUR NEGATIVE  PROTEINUR 100*  NITRITE NEGATIVE  LEUKOCYTESUR LARGE*        Imaging: Ct Head Wo Contrast  Result Date: 02/11/2019 CLINICAL DATA:  Altered level of consciousness. EXAM: CT HEAD WITHOUT CONTRAST TECHNIQUE:  Contiguous axial images were obtained from the base of the skull through the vertex without intravenous contrast. COMPARISON:  None. FINDINGS: Brain: Atrophy with sulcal prominence centralized volume loss with commensurate ex vacuo dilatation of the ventricular system. Rather extensive periventricular hypodensities compatible with microvascular ischemic disease. 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 Bilateral basal ganglial calcifications. Given extensive background parenchymal abnormalities, there is no CT evidence of superimposed acute large territory infarct. No intraparenchymal or extra-axial mass or hemorrhage. Normal configuration of the ventricles and the basilar cisterns. No midline shift. Vascular: Intracranial atherosclerosis. Skull: No displaced calvarial fracture. Sinuses/Orbits: There is underpneumatization of the bilateral frontal sinuses. Small air-fluid level noted within the right sphenoid sinus. The remaining paranasal sinuses and mastoid air cells are normally aerated. Post bilateral cataract surgery  with additional postoperative change of the left lobe. Other: Regional soft tissues appear normal. IMPRESSION: Advanced atrophy and microvascular ischemic disease without superimposed acute intracranial process. Electronically Signed   By: Sandi Mariscal M.D.   On: 02/11/2019 12:59   Mr Brain Wo Contrast  Result Date: 02/11/2019 CLINICAL DATA:  Altered mental status. Recent dialysis. Hypertensive. Confusion. EXAM: MRI HEAD WITHOUT CONTRAST TECHNIQUE: Multiplanar, multiecho pulse sequences of the brain and surrounding structures were obtained without intravenous contrast. COMPARISON:  CT head earlier in the day. FINDINGS: Brain: No evidence for acute infarction, hemorrhage, mass lesion, hydrocephalus, or extra-axial fluid. Generalized atrophy. Advanced T2 and FLAIR hyperintensities throughout the white matter, consistent with chronic microvascular ischemic change. Prominent perivascular spaces suggesting longstanding hypertension. 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 Vascular: Flow voids are maintained throughout the carotid, basilar, and vertebral arteries. There are no areas of chronic hemorrhage. Skull and upper cervical spine: Unremarkable visualized calvarium, skullbase, and cervical vertebrae. Pituitary, pineal, cerebellar tonsils unremarkable. No upper cervical cord lesions. Sinuses/Orbits: No orbital masses or proptosis. Globes appear symmetric. BILATERAL cataract extraction. Sinuses appear well aerated, except for trace layering sphenoid fluid. Other: None. IMPRESSION: Atrophy and small vessel disease. No acute intracranial findings. Electronically Signed   By: Staci Righter M.D.   On: 02/11/2019 18:22   US Carotid Bilateral (at [facility] Only)  Result Date: 02/11/2019 CLINICAL DATA:  Right-sided weakness EXAM: BILATERAL CAROTID DUPLEX ULTRASOUND TECHNIQUE: Pearline Cables scale imaging, color Doppler and duplex ultrasound were  performed of bilateral carotid and vertebral arteries in the neck. COMPARISON:  None. FINDINGS: Criteria: Quantification of carotid stenosis is based on velocity parameters that correlate the residual internal carotid diameter with NASCET-based stenosis levels, using the diameter of the distal internal carotid lumen as the denominator for stenosis measurement. The following velocity measurements were obtained: RIGHT ICA: 105 cm/sec CCA: 90 cm/sec SYSTOLIC ICA/CCA RATIO:  1.2 ECA: 348 cm/sec LEFT ICA: 138 cm/sec CCA: [id number] cm/sec SYSTOLIC ICA/CCA RATIO:  1.2 ECA: 2 318 cm/sec RIGHT CAROTID ARTERY: There is mild intimal thickening of the right CCA without evidence for a high-grade stenosis. There are calcified and noncalcified plaques involving the distal right CCA resulting in around 50% stenosis. There are atherosclerotic plaques involving the right carotid bulb resulting in around 50% stenosis. There is atherosclerotic plaque at the origin of the right ECA resulting in a moderate to high-grade stenosis. There is calcified plaque involving the proximal right ICA resulting in less than 50% stenosis by grayscale and color imaging. There are calcified and noncalcified plaque throughout the remaining portion of the right ICA without evidence for high-grade stenosis. RIGHT VERTEBRAL ARTERY:  Antegrade flow is noted. LEFT CAROTID ARTERY: There is some mild intimal thickening of the left CCA without evidence for high-grade stenosis. There are calcified and noncalcified plaques involving the carotid bulb resulting in greater than 50% stenosis by grayscale and color Doppler imaging. There are atherosclerotic plaques at the origin of the left ECA resulting in a moderate to high-grade stenosis. There is calcified plaque at the origin of the left ICA resulting in greater than 50% stenosis by grayscale and color imaging. The remaining portions of the left ICA demonstrate mild-to-moderate calcified and noncalcified plaques without  evidence for a high-grade stenosis. LEFT VERTEBRAL ARTERY:  Antegrade flow is noted IMPRESSION: 1. Calcified and noncalcified plaque involving the right carotid bulb and right ICA resulting in less than 50% stenosis by both grayscale and color Doppler imaging. 2. 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 3. Elevated peak systolic velocities within both external carotid arteries likely representing a moderate to high-grade stenosis. 4. Antegrade flow within both vertebral arteries. Electronically Signed   By: Constance Holster M.D.   On: 02/11/2019 20:28   Dg Chest Port 1 View  Result Date: 02/11/2019 CLINICAL DATA:  Altered mental status. EXAM: PORTABLE CHEST 1 VIEW COMPARISON:  None. FINDINGS: Normal sized heart. Tortuous and calcified thoracic aorta. Clear lungs. Right axillary stent. Unremarkable bones. IMPRESSION: No acute abnormality. Electronically Signed   By: Claudie Revering M.D.   On: 02/11/2019 11:48      Assessment & Plan: Pt is a 78 y.o. African-American  male with Diabetes, HTN, ESRD, CAD was admitted on 02/11/2019 with generalized weakness, confusion and fever.   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  MWF.  [facility] nephrology -We will arrange for routine hemodialysis on Monday -Patient appears euvolemic at present - Hep B s Ag neg 01/16/2019 (Care Everywhere)  #Anemia of chronic kidney disease -Low-dose Epogen with hemodialysis  #Secondary hyperparathyroidism -Monitor calcium and phosphorus during this admission  #Right-sided weakness -Neurology team is following -stroke work-up in progress        LOS: 1 Rachel Rison 10/11/202010:00 AM    Note: This note was prepared with Dragon dictation. Any transcription errors are unintentional

## 2019-02-12 NOTE — Progress Notes (Signed)
Pt was educated about stroke prevention and was given hand out. Will notify incoming shift. Will continue to monitor.

## 2019-02-12 NOTE — Progress Notes (Signed)
After speaking with the pts daughter, Milly Jakob, she explained how she recently took her father out of a SNF in [place] to be closer to home since she lives in [place]. She said it has gotten harder to take care of the pt at home due to his increased needs and because she works full time. Milly Jakob is interested in placing her father in a SNF instead of taking him back home. Case management consult placed.

## 2019-02-13 ENCOUNTER — Inpatient Hospital Stay: Payer: Medicare Other

## 2019-02-13 DIAGNOSIS — R531 Weakness: Secondary | ICD-10-CM

## 2019-02-13 LAB — GLUCOSE, CAPILLARY
Glucose-Capillary: 147 mg/dL — ABNORMAL HIGH (ref 70–99)
Glucose-Capillary: 160 mg/dL — ABNORMAL HIGH (ref 70–99)
Glucose-Capillary: 139 mg/dL — ABNORMAL HIGH (ref 70–99)

## 2019-02-13 LAB — HEPATITIS B SURFACE ANTIGEN: Hepatitis B Surface Ag: NONREACTIVE

## 2019-02-13 LAB — PHOSPHORUS: Phosphorus: 4.8 mg/dL — ABNORMAL HIGH (ref 2.5–4.6)

## 2019-02-13 MED ORDER — HEPARIN SODIUM (PORCINE) 1000 UNIT/ML DIALYSIS
20.0000 [IU]/kg | INTRAMUSCULAR | Status: DC | PRN
  Filled 2019-02-13: qty 2

## 2019-02-13 NOTE — Progress Notes (Signed)
OT Cancellation Note  Patient Details Name: Gregory Davenport MRN: [id number] DOB: Jul 27, 1940   Cancelled Treatment:    Reason Eval/Treat Not Completed: Patient at procedure or test/ unavailable. Pt unavailable for OT evaluation - getting renal US. Will re-attempt OT evaluation at later date/time as pt is available and medically appropriate.  Jeni Salles, MPH, MS, OTR/L ascom [phone] 02/13/19, 3:35 PM

## 2019-02-13 NOTE — Progress Notes (Addendum)
[facility] [place] NAME: Gregory Davenport    MR#:  [id number]  DATE OF BIRTH:  1941-03-28  SUBJECTIVE:  CHIEF COMPLAINT:   Chief Complaint  Patient presents with  . Altered Mental Status   Came with right-sided weakness.  Currently feels weakness is improving much. Noted no acute stroke on MRI.  REVIEW OF SYSTEMS:  CONSTITUTIONAL: No fever, fatigue or weakness.  EYES: No blurred or double vision.  EARS, NOSE, AND THROAT: No tinnitus or ear pain.  RESPIRATORY: No cough, shortness of breath, wheezing or hemoptysis.  CARDIOVASCULAR: No chest pain, orthopnea, edema.  GASTROINTESTINAL: No nausea, vomiting, diarrhea or abdominal pain.  GENITOURINARY: No dysuria, hematuria.  ENDOCRINE: No polyuria, nocturia,  HEMATOLOGY: No anemia, easy bruising or bleeding SKIN: No rash or lesion. MUSCULOSKELETAL: No joint pain or arthritis.   NEUROLOGIC: No tingling, numbness, weakness.  PSYCHIATRY: No anxiety or depression.   ROS  DRUG ALLERGIES:   Allergies  Allergen Reactions  . Other Other (See Comments)    Combative with opioid medications    VITALS:  Blood pressure (!) 177/70, pulse 67, temperature 97.9 F (36.6 C), temperature source Oral, resp. rate 16, height 6' (1.829 m), weight 92.1 kg, SpO2 100 %.  PHYSICAL EXAMINATION:  GENERAL:  78 y.o.-year-old patient lying in the bed with no acute distress.  EYES: Pupils equal, round, reactive to light and accommodation. No scleral icterus. Extraocular muscles intact.  HEENT: Head atraumatic, normocephalic. Oropharynx and nasopharynx clear.  NECK:  Supple, no jugular venous distention. No thyroid enlargement, no tenderness.  LUNGS: Normal breath sounds bilaterally, no wheezing, rales,rhonchi or crepitation. No use of accessory muscles of respiration.  CARDIOVASCULAR: S1, S2 normal. No murmurs, rubs, or gallops.  ABDOMEN: Soft, nontender, nondistended. Bowel sounds present. No organomegaly or mass.   EXTREMITIES: No pedal edema, cyanosis, or clubbing.  NEUROLOGIC: Cranial nerves II through XII are intact. Muscle strength 4/5 in all extremities-somewhat decreased in the right side but able to move and follows,. Sensation intact. Gait not checked.  PSYCHIATRIC: The patient is alert and oriented x 3.  SKIN: No obvious rash, lesion, or ulcer.   Physical Exam LABORATORY PANEL:   CBC Recent Labs  Lab 02/12/19 0713  WBC 6.2  HGB 11.8*  HCT 37.1*  PLT 181   ------------------------------------------------------------------------------------------------------------------  Chemistries  Recent Labs  Lab 02/11/19 1127 02/12/19 0713  NA 136 138  K 3.6 3.7  CL 94* 96*  CO2 29 28  GLUCOSE 107* 116*  BUN 25* 33*  CREATININE 7.57* 9.29*  CALCIUM 9.1 9.0  AST 18  --   ALT 15  --   ALKPHOS 58  --   BILITOT 0.8  --    ------------------------------------------------------------------------------------------------------------------  Cardiac Enzymes No results for input(s): TROPONINI in the last 168 hours. ------------------------------------------------------------------------------------------------------------------  RADIOLOGY:  Mr Brain Wo Contrast  Result Date: 02/11/2019 CLINICAL DATA:  Altered mental status. Recent dialysis. Hypertensive. Confusion. EXAM: MRI HEAD WITHOUT CONTRAST TECHNIQUE: Multiplanar, multiecho pulse sequences of the brain and surrounding structures were obtained without intravenous contrast. COMPARISON:  CT head earlier in the day. FINDINGS: Brain: No evidence for acute infarction, hemorrhage, mass lesion, hydrocephalus, or extra-axial fluid. Generalized atrophy. Advanced T2 and FLAIR hyperintensities throughout the white matter, consistent with chronic microvascular ischemic change. Prominent perivascular spaces suggesting longstanding hypertension. 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 Vascular: Flow voids are maintained throughout the carotid, basilar, and vertebral  arteries. There are no areas of chronic hemorrhage. Skull and upper cervical spine: Unremarkable visualized calvarium, skullbase, and cervical vertebrae. Pituitary, pineal, cerebellar tonsils unremarkable. No upper cervical cord lesions. Sinuses/Orbits: No orbital masses or proptosis. Globes appear symmetric. BILATERAL cataract extraction. Sinuses appear well aerated, except for trace layering sphenoid fluid. Other: None. IMPRESSION: Atrophy and small vessel disease. No acute intracranial findings. Electronically Signed   By: Staci Righter M.D.   On: 02/11/2019 18:22   US Carotid Bilateral (at [facility] Only)  Result Date: 02/11/2019 CLINICAL DATA:  Right-sided weakness EXAM: BILATERAL CAROTID DUPLEX ULTRASOUND TECHNIQUE: Pearline Cables scale imaging, color Doppler and duplex ultrasound were performed of bilateral carotid and vertebral arteries in the neck. COMPARISON:  None. FINDINGS: Criteria: Quantification of carotid stenosis is based on velocity parameters that correlate the residual internal carotid diameter with NASCET-based stenosis levels, using the diameter of the distal internal carotid lumen as the denominator for stenosis measurement. The following velocity measurements were obtained: RIGHT ICA: 105 cm/sec CCA: 90 cm/sec SYSTOLIC ICA/CCA RATIO:  1.2 ECA: 348 cm/sec LEFT ICA: 138 cm/sec CCA: [id number] cm/sec SYSTOLIC ICA/CCA RATIO:  1.2 ECA: 2 318 cm/sec RIGHT CAROTID ARTERY: There is mild intimal thickening of the right CCA without evidence for a high-grade stenosis. There are calcified and noncalcified plaques involving the distal right CCA resulting in around 50% stenosis. There are atherosclerotic plaques involving the right carotid bulb resulting in around 50% stenosis. There is atherosclerotic plaque at the origin of the right ECA resulting in a moderate to high-grade stenosis. There is calcified plaque involving  the proximal right ICA resulting in less than 50% stenosis by grayscale and color imaging. There are calcified and noncalcified plaque throughout the remaining portion of the right ICA without evidence for high-grade stenosis. RIGHT VERTEBRAL ARTERY:  Antegrade flow is noted. LEFT CAROTID ARTERY: There is some mild intimal thickening of the left CCA without evidence for high-grade stenosis. There are calcified and noncalcified plaques involving the carotid bulb resulting in greater than 50% stenosis by grayscale and color Doppler imaging. There are atherosclerotic plaques at the origin of the left ECA resulting in a moderate to high-grade stenosis. There is calcified plaque at the origin of the left ICA resulting in greater than 50% stenosis by grayscale and color imaging. The remaining portions of the left ICA demonstrate mild-to-moderate calcified and noncalcified plaques without evidence for a high-grade stenosis. LEFT VERTEBRAL ARTERY:  Antegrade flow is noted IMPRESSION: 1. Calcified and noncalcified plaque involving the right carotid bulb and right ICA resulting in less than 50% stenosis by both grayscale and color Doppler imaging. 2. 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 3. Elevated peak systolic velocities within both external carotid arteries likely representing a moderate to high-grade stenosis. 4. Antegrade flow within both vertebral arteries. Electronically Signed   By: Constance Holster M.D.   On: 02/11/2019 20:28    ASSESSMENT AND PLAN:   Active Problems:   Right sided weakness  1.  Right-sided weakness starting  after dialysis.  CT scan of the head negative.   MRI brain is negative.  Carotid Doppler study did not show significant acute blockages.  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   LDL is 95, on atorvastatin 40 mg. Continue aspirin.   Swallow screen.  Physical therapy occupational therapy evaluations.  Monitor on telemetry. Appreciate  neurology consult. PT suggested SNF placement.  2.  Fever with normal white count.  Urine  analysis does look positive   Follow-up blood cultures are negative and pending urine cultures.  Chest x-ray negative.     Continue the Rocephin started in the ED.  COVID-19 negative.  3.  End-stage renal disease on dialysis Monday Wednesday Friday.  Appreciated Consult nephrology.  4.  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  Decrease dose of Lantus down to 8 units and sliding scale.  Checked a hemoglobin A1c-6.9.  5.  Essential hypertension.  Continue usual medications. Allow permissive hypertension on first day and started on amlodipine.     All the records are reviewed and case discussed with Care Management/Social Workerr. Management plans discussed with the patient, family and they are in agreement.  CODE STATUS: Full  TOTAL TIME TAKING CARE OF THIS PATIENT: 35 minutes.   I called and discussed his case findings and plan with his daughter on the phone. He was a long-term resident at one of the facility at their home.  Daughter was trying to move him near her home in [place], they had done [place] 2 and all other paperwork but at the last moment his insurance denied and so they ended up taking him home for last 1 week.  POSSIBLE D/C IN 1-2 DAYS, DEPENDING ON CLINICAL CONDITION.   Vaughan Basta M.D on 02/13/2019   Between 7am to 6pm - Pager - [phone]  After 6pm go to [email or web address] - password [email or web address] [facility]  Office  [phone]  CC: Primary care physician; Delilah Shan, MD  Note: This dictation was prepared with Dragon dictation along with smaller phrase technology. Any transcriptional errors that result from this process are unintentional.

## 2019-02-13 NOTE — TOC Initial Note (Signed)
Transition of Care [facility]) - Initial/Assessment Note    Patient Details  Name: Gregory Davenport MRN: [id number] Date of Birth: Jan 04, 1941  Transition of Care [facility]) CM/SW Contact:    Ross Ludwig, LCSW Phone Number: 02/13/2019, 6:13 PM  Clinical Narrative:                  Patient is an 78 year old male who is alert and oriented x4.  Patient states he was living with his daughter for about a month, and previously he was at [facility] for long term care in [place].  Patient's daughter moved to [place], and would like patient to go to SNF for rehab, then transition to long term care.  Patient has long term care medicaid in place already.  Patient is a dialysis patient MWF at 1230pm.  Patient's daughter would like patient to be placed either in [place] or [place] depending on availability.  Patient and daughter gave CSW permission to begin bed search in [place].  Patient and daughter were explained how insurance will pay for stay at SNF.  Patient and daughter did not have any other questions or concerns.  Expected Discharge Plan: Skilled Nursing Facility Barriers to Discharge: SNF Pending bed offer, Continued Medical Work up   Patient Goals and CMS Choice Patient states their goals for this hospitalization and ongoing recovery are:: To go to SNF for short term rehab, then transition to long term care. CMS Medicare.gov Compare Post Acute Care list provided to:: Patient Choice offered to / list presented to : Patient  Expected Discharge Plan and Services Expected Discharge Plan: [place] Choice: [place] Living arrangements for the past 2 months: [place], [place]                                      Prior Living Arrangements/Services Living arrangements for the past 2 months: [place] Lives with:: Facility Resident, Adult Children Patient language and need  for interpreter reviewed:: Yes Do you feel safe going back to the place where you live?: No   Patient and daughter feel he needs SNF placement.  Need for Family Participation in Patient Care: Yes (Comment) Care giver support system in place?: Yes (comment)   Criminal Activity/Legal Involvement Pertinent to Current Situation/Hospitalization: No - Comment as needed  Activities of Daily Living Home Assistive Devices/Equipment: Wheelchair, Environmental consultant (specify type), Dentures (specify type) ADL Screening (condition at time of admission) Patient's cognitive ability adequate to safely complete daily activities?: Yes Is the patient deaf or have difficulty hearing?: No Does the patient have difficulty seeing, even when wearing glasses/contacts?: Yes Does the patient have difficulty concentrating, remembering, or making decisions?: Yes Patient able to express need for assistance with ADLs?: Yes Does the patient have difficulty dressing or bathing?: No Independently performs ADLs?: Yes (appropriate for developmental age) Does the patient have difficulty walking or climbing stairs?: No Weakness of Legs: Both Weakness of Arms/Hands: None  Permission Sought/Granted Permission sought to share information with : Facility Sport and exercise psychologist, Family Supports Permission granted to share information with : Yes, Verbal Permission Granted  Share Information with NAME: [name] Daughter   [phone]  Permission granted to share info w AGENCY: SNF admissions        Emotional Assessment Appearance:: Appears stated age Attitude/Demeanor/Rapport: Engaged Affect (typically observed): Accepting,  Stable, Pleasant Orientation: : Oriented to Self, Oriented to  Time, Oriented to Place, Oriented to Situation Alcohol / Substance Use: Not Applicable Psych Involvement: No (comment)  Admission diagnosis:  Metabolic encephalopathy 99991111 Acute cystitis without hematuria [N30.00] Right sided weakness  [R53.1] Patient Active Problem List   Diagnosis Date Noted  . Right sided weakness 02/11/2019   PCP:  Delilah Shan, MD Pharmacy:   [facility], [place], [place] - [address] [place] [address] Phone: [phone] Fax: [phone]     Social Determinants of Health (SDOH) Interventions    Readmission Risk Interventions No flowsheet data found.

## 2019-02-13 NOTE — Progress Notes (Signed)
Pre HD Tx:   02/13/19 0914  Vital Signs  Temp 98.6 F (37 C)  Temp Source Oral  Pulse Rate 70  Pulse Rate Source Dinamap  Resp 16  BP (!) 141/61  BP Location Left Arm  BP Method Automatic  Patient Position (if appropriate) Lying  Oxygen Therapy  SpO2 99 %  O2 Device Room Air  Pain Assessment  Pain Scale 0-10  Pain Score 0  Dialysis Weight  Weight 92 kg  Type of Weight Pre-Dialysis  Time-Out for Hemodialysis  What Procedure? HD   Pt Identifiers(min of two) First/Last Name;MRN/Account#;Pt's DOB(use if MRN/Acct# not available  Correct Site? Yes  Correct Side? Yes  Correct Procedure? Yes  Consents Verified? Yes  Rad Studies Available? N/A  Safety Precautions Reviewed? Yes  Engineer, civil (consulting) Number [place] Number 2  UF/Alarm Test Passed  Conductivity: Meter 13.8  Conductivity: Machine  14.1  pH 7.2  Reverse Osmosis main  Normal Saline Lot Number [id number]  Dialyzer Lot Number [id number]  Disposable Set Lot Number [id number]  Dialysate Acid Bath Lot Number [id number]  Dialysate HCO3 Bath Lot Number [id number]  Machine Temperature 98.6 F (37 C)  Musician and Audible Yes  Blood Lines Intact and Secured Yes  Pre Treatment Patient Checks  Vascular access used during treatment Graft  HD catheter dressing before treatment  (no)  Patient is receiving dialysis in a chair  (no)  Hepatitis B Surface Antigen Results Negative  Date Hepatitis B Surface Antigen Drawn 06/07/18  Isolation Initiated  (no)  Hepatitis B Surface Antibody  (325)  Date Hepatitis B Surface Antibody Drawn 06/07/18  Hemodialysis Consent Verified Yes  Hemodialysis Standing Orders Initiated Yes  ECG (Telemetry) Monitor On Yes  Prime Ordered Normal Saline  Length of  DialysisTreatment -hour(s) 3.5 Hour(s)  Dialysis Treatment Comments  (Na 140)  Dialyzer Elisio 17H NR  Dialysate 3K;2.5 Ca  Dialysate Flow Ordered 600  Blood Flow Rate Ordered 400 mL/min  Ultrafiltration Goal 1 Liters   Dialysis Blood Pressure Support Ordered Normal Saline  Education / Care Plan  Dialysis Education Provided Yes  Documented Education in Care Plan Yes  Outpatient Plan of Care Reviewed and on Chart Yes  Fistula / Graft  No Placement Date or Time found.   Placed prior to admission: Yes  Site Condition No complications  Fistula / Graft Assessment Bruit;Thrill;Present  Status Accessed  Needle Size 15  Drainage Description None

## 2019-02-13 NOTE — Progress Notes (Signed)
Post HD Assessment:    02/13/19 1310  Neurological  Level of Consciousness Alert  Orientation Level Oriented X4  Respiratory  Respiratory Pattern Regular;Unlabored;Dyspnea with exertion  Chest Assessment Chest expansion symmetrical  Bilateral Breath Sounds Diminished  Cardiac  Pulse Regular  Heart Sounds S1, S2  Jugular Venous Distention (JVD) No  Cardiac Rhythm Other (Comment) (SVPB rhythm)  Vascular  R Radial Pulse +2  L Radial Pulse +2  Psychosocial  Psychosocial (WDL) WDL

## 2019-02-13 NOTE — Progress Notes (Signed)
Subjective: [place]ation much improved and close to baseline   Past Medical History:  Diagnosis Date  . CAD (coronary artery disease)   . Diabetes mellitus without complication ([place])   . Dialysis patient ([place])   . End stage renal disease ([place])   . Heart attack ([place])   . Hypertension     Past Surgical History:  Procedure Laterality Date  . EYE SURGERY    . fistula right arm      Family History  Problem Relation Age of Onset  . Dementia Mother   . Hypertension Mother   . Diabetes Sister   . Kidney failure Sister     Social History:  reports that he has quit smoking. He has never used smokeless tobacco. He reports that he does not drink alcohol or use drugs.  Allergies  Allergen Reactions  . Other Other (See Comments)    Combative with opioid medications    Medications: I have reviewed the patient's current medications.  ROS: As per HPI Physical Examination: Blood pressure (!) 154/68, pulse 68, temperature 98.6 F (37 C), temperature source Oral, resp. rate 16, height 6' (1.829 m), weight 92 kg, SpO2 97 %.  Neurologic Examination Alert, awake, speech nle, follows commands Patient is legally blind. PERLA, EOM seems intact, VFF not performed, no nystagmus, flattening of right naso-labial fold, face sensation seems nle, uvula and tongue midline Motor: 5/5 in LUEX/RLEX, he is 4/5 RUEX/RLEX No sensory deficit appreciated ( though difficult to conduct) No coordination deficit appreciated DTR and gait not checked at the time of examination.  Results for orders placed or performed during the hospital encounter of 02/11/19 (from the past 48 hour(s))  Lactic acid, plasma     Status: None   Collection Time: 02/11/19 11:27 AM  Result Value Ref Range   Lactic Acid, Venous 0.9 0.5 - 1.9 mmol/L    Comment: Performed at [facility], [address] [place], [place] [address]  Comprehensive metabolic panel     Status: Abnormal   Collection Time: 02/11/19 11:27 AM   Result Value Ref Range   Sodium 136 135 - 145 mmol/L   Potassium 3.6 3.5 - 5.1 mmol/L   Chloride 94 (L) 98 - 111 mmol/L   CO2 29 22 - 32 mmol/L   Glucose, Bld 107 (H) 70 - 99 mg/dL   BUN 25 (H) 8 - 23 mg/dL   Creatinine, Ser 7.57 (H) 0.61 - 1.24 mg/dL   Calcium 9.1 8.9 - 10.3 mg/dL   Total Protein 7.3 6.5 - 8.1 g/dL   Albumin 3.6 3.5 - 5.0 g/dL   AST 18 15 - 41 U/L   ALT 15 0 - 44 U/L   Alkaline Phosphatase 58 38 - 126 U/L   Total Bilirubin 0.8 0.3 - 1.2 mg/dL   GFR calc non Af Amer 6 (L) >60 mL/min   GFR calc Af Amer 7 (L) >60 mL/min   Anion gap 13 5 - 15    Comment: Performed at [facility], [address] [place], [place] [address]  CBC WITH DIFFERENTIAL     Status: Abnormal   Collection Time: 02/11/19 11:27 AM  Result Value Ref Range   WBC 7.4 4.0 - 10.5 K/uL   RBC 3.57 (L) 4.22 - 5.81 MIL/uL   Hemoglobin 11.2 (L) 13.0 - 17.0 g/dL   HCT 35.8 (L) 39.0 - 52.0 %   MCV 100.3 (H) 80.0 - 100.0 fL   MCH 31.4 26.0 - 34.0 pg   MCHC  31.3 30.0 - 36.0 g/dL   RDW 14.5 11.5 - 15.5 %   Platelets 168 150 - 400 K/uL   nRBC 0.0 0.0 - 0.2 %   Neutrophils Relative % 54 %   Neutro Abs 4.0 1.7 - 7.7 K/uL   Lymphocytes Relative 29 %   Lymphs Abs 2.1 0.7 - 4.0 K/uL   Monocytes Relative 15 %   Monocytes Absolute 1.1 (H) 0.1 - 1.0 K/uL   Eosinophils Relative 2 %   Eosinophils Absolute 0.1 0.0 - 0.5 K/uL   Basophils Relative 0 %   Basophils Absolute 0.0 0.0 - 0.1 K/uL   Immature Granulocytes 0 %   Abs Immature Granulocytes 0.02 0.00 - 0.07 K/uL    Comment: Performed at [facility], [address] [place], [place] [address]  APTT     Status: None   Collection Time: 02/11/19 11:27 AM  Result Value Ref Range   aPTT 32 24 - 36 seconds    Comment: Performed at [facility], [address] [place], [place] [address]  Protime-INR     Status: None   Collection Time: 02/11/19 11:27 AM  Result Value Ref Range   Prothrombin Time 13.1 11.4 - 15.2 seconds   INR  1.0 0.8 - 1.2    Comment: (NOTE) INR goal varies based on device and disease states. Performed at [facility], [address] [place], [place] [address]   Blood Culture (routine x 2)     Status: None (Preliminary result)   Collection Time: 02/11/19 11:27 AM   Specimen: BLOOD  Result Value Ref Range   Specimen Description BLOOD BLOOD LEFT HAND    Special Requests      BOTTLES DRAWN AEROBIC AND ANAEROBIC Blood Culture adequate volume   Culture      NO GROWTH 2 DAYS Performed at [facility], [address] [place],  [address]    Report Status PENDING   Procalcitonin     Status: None   Collection Time: 02/11/19 11:27 AM  Result Value Ref Range   Procalcitonin 0.99 ng/mL    Comment:        Interpretation: PCT > 0.5 ng/mL and <= 2 ng/mL: Systemic infection (sepsis) is possible, but other conditions are known to elevate PCT as well. (NOTE)       Sepsis PCT Algorithm           Lower Respiratory Tract                                      Infection PCT Algorithm    ----------------------------     ----------------------------         PCT < 0.25 ng/mL                PCT < 0.10 ng/mL         Strongly encourage             Strongly discourage   discontinuation of antibiotics    initiation of antibiotics    ----------------------------     -----------------------------       PCT 0.25 - 0.50 ng/mL            PCT 0.10 - 0.25 ng/mL               OR       >80% decrease in PCT  Discourage initiation of                                            antibiotics      Encourage discontinuation           of antibiotics    ----------------------------     -----------------------------         PCT >= 0.50 ng/mL              PCT 0.26 - 0.50 ng/mL                AND       <80% decrease in PCT             Encourage initiation of                                             antibiotics       Encourage continuation           of antibiotics     ----------------------------     -----------------------------        PCT >= 0.50 ng/mL                  PCT > 0.50 ng/mL               AND         increase in PCT                  Strongly encourage                                      initiation of antibiotics    Strongly encourage escalation           of antibiotics                                     -----------------------------                                           PCT <= 0.25 ng/mL                                                 OR                                        > 80% decrease in PCT                                     Discontinue / Do not initiate  antibiotics Performed at [facility], [address] [place], [place] [address]   Hemoglobin A1c     Status: Abnormal   Collection Time: 02/11/19 11:27 AM  Result Value Ref Range   Hgb A1c MFr Bld 6.9 (H) 4.8 - 5.6 %    Comment: (NOTE) Pre diabetes:          5.7%-6.4% Diabetes:              >6.4% Glycemic control for   <7.0% adults with diabetes    Mean Plasma Glucose 151.33 mg/dL    Comment: Performed at [facility] [address], [place], [place] [address]  Blood Culture (routine x 2)     Status: None (Preliminary result)   Collection Time: 02/11/19 11:28 AM   Specimen: BLOOD  Result Value Ref Range   Specimen Description BLOOD BLOOD LEFT ARM    Special Requests      BOTTLES DRAWN AEROBIC AND ANAEROBIC Blood Culture adequate volume   Culture      NO GROWTH 2 DAYS Performed at [facility], [address] [place], [place] [address]    Report Status PENDING   Lactic acid, plasma     Status: None   Collection Time: 02/11/19  1:10 PM  Result Value Ref Range   Lactic Acid, Venous 0.9 0.5 - 1.9 mmol/L    Comment: Performed at [facility], [address] [place], [place] [address]  Urine culture     Status: Abnormal (Preliminary result)   Collection Time: 02/11/19  1:10 PM    Specimen: In/Out Cath Urine  Result Value Ref Range   Specimen Description      IN/OUT CATH URINE Performed at [facility], [address], [place], [place] [address]    Special Requests      NONE Performed at [facility], [address] [place], [place] [address]    Culture (A)     >=100,000 COLONIES/mL ESCHERICHIA COLI SUSCEPTIBILITIES TO FOLLOW Performed at [facility], [place] [address] [place], [place] [address]    Report Status PENDING   Urinalysis, Complete w Microscopic     Status: Abnormal   Collection Time: 02/11/19  1:10 PM  Result Value Ref Range   Color, Urine AMBER (A) YELLOW    Comment: BIOCHEMICALS MAY BE AFFECTED BY COLOR   APPearance CLOUDY (A) CLEAR   Specific Gravity, Urine 1.013 1.005 - 1.030   pH 6.0 5.0 - 8.0   Glucose, UA NEGATIVE NEGATIVE mg/dL   Hgb urine dipstick MODERATE (A) NEGATIVE   Bilirubin Urine NEGATIVE NEGATIVE   Ketones, ur NEGATIVE NEGATIVE mg/dL   Protein, ur 100 (A) NEGATIVE mg/dL   Nitrite NEGATIVE NEGATIVE   Leukocytes,Ua LARGE (A) NEGATIVE   RBC / HPF 21-50 0 - 5 RBC/hpf   WBC, UA >50 (H) 0 - 5 WBC/hpf   Bacteria, UA MANY (A) NONE SEEN   Squamous Epithelial / LPF 6-10 0 - 5   WBC Clumps PRESENT     Comment: Performed at [facility], [address] [place],  [address]  SARS Coronavirus 2 by RT PCR (hospital order, performed in [place] hospital lab) Nasopharyngeal Nasopharyngeal Swab     Status: None   Collection Time: 02/11/19  1:10 PM   Specimen: Nasopharyngeal Swab  Result Value Ref Range   SARS Coronavirus 2 NEGATIVE NEGATIVE    Comment: (NOTE) If result is NEGATIVE SARS-CoV-2 target nucleic acids are NOT DETECTED. The  SARS-CoV-2 RNA is generally detectable in upper and lower  respiratory specimens during the acute phase of infection. The lowest  concentration of SARS-CoV-2 viral copies this assay can detect is 250  copies / mL. A negative result does not preclude SARS-CoV-2  infection  and should not be used as the sole basis for treatment or other  patient management decisions.  A negative result may occur with  improper specimen collection / handling, submission of specimen other  than nasopharyngeal swab, presence of viral mutation(s) within the  areas targeted by this assay, and inadequate number of viral copies  (<250 copies / mL). A negative result must be combined with clinical  observations, patient history, and epidemiological information. If result is POSITIVE SARS-CoV-2 target nucleic acids are DETECTED. The SARS-CoV-2 RNA is generally detectable in upper and lower  respiratory specimens dur ing the acute phase of infection.  Positive  results are indicative of active infection with SARS-CoV-2.  Clinical  correlation with patient history and other diagnostic information is  necessary to determine patient infection status.  Positive results do  not rule out bacterial infection or co-infection with other viruses. If result is PRESUMPTIVE POSTIVE SARS-CoV-2 nucleic acids MAY BE PRESENT.   A presumptive positive result was obtained on the submitted specimen  and confirmed on repeat testing.  While 2019 novel coronavirus  (SARS-CoV-2) nucleic acids may be present in the submitted sample  additional confirmatory testing may be necessary for epidemiological  and / or clinical management purposes  to differentiate between  SARS-CoV-2 and other Sarbecovirus currently known to infect humans.  If clinically indicated additional testing with an alternate test  methodology [phone]) is advised. The SARS-CoV-2 RNA is generally  detectable in upper and lower respiratory sp ecimens during the acute  phase of infection. The expected result is Negative. Fact Sheet for Patients:  StrictlyIdeas.no Fact Sheet for Healthcare Providers: BankingDealers.co.za This test is not yet approved or cleared by the [place]  FDA and has been authorized for detection and/or diagnosis of SARS-CoV-2 by FDA under an Emergency Use Authorization (EUA).  This EUA will remain in effect (meaning this test can be used) for the duration of the COVID-19 declaration under Section 564(b)(1) of the Act, 21 U.S.C. section 360bbb-3(b)(1), unless the authorization is terminated or revoked sooner. Performed at [facility], [address] [place], [place] [address]   Glucose, capillary     Status: Abnormal   Collection Time: 02/11/19  8:52 PM  Result Value Ref Range   Glucose-Capillary 191 (H) 70 - 99 mg/dL  CBC     Status: Abnormal   Collection Time: 02/12/19  7:13 AM  Result Value Ref Range   WBC 6.2 4.0 - 10.5 K/uL   RBC 3.72 (L) 4.22 - 5.81 MIL/uL   Hemoglobin 11.8 (L) 13.0 - 17.0 g/dL   HCT 37.1 (L) 39.0 - 52.0 %   MCV 99.7 80.0 - 100.0 fL   MCH 31.7 26.0 - 34.0 pg   MCHC 31.8 30.0 - 36.0 g/dL   RDW 14.3 11.5 - 15.5 %   Platelets 181 150 - 400 K/uL   nRBC 0.0 0.0 - 0.2 %    Comment: Performed at [facility], [address], [place], [place] [id number]  Basic metabolic panel     Status: Abnormal   Collection Time: 02/12/19  7:13 AM  Result Value Ref Range   Sodium 138 135 - 145 mmol/L   Potassium 3.7 3.5 - 5.1 mmol/L  Chloride 96 (L) 98 - 111 mmol/L   CO2 28 22 - 32 mmol/L   Glucose, Bld 116 (H) 70 - 99 mg/dL   BUN 33 (H) 8 - 23 mg/dL   Creatinine, Ser 9.29 (H) 0.61 - 1.24 mg/dL   Calcium 9.0 8.9 - 10.3 mg/dL   GFR calc non Af Amer 5 (L) >60 mL/min   GFR calc Af Amer 6 (L) >60 mL/min   Anion gap 14 5 - 15    Comment: Performed at [facility], [address] [place], [place] [address]  Lipid panel     Status: Abnormal   Collection Time: 02/12/19  7:13 AM  Result Value Ref Range   Cholesterol 151 0 - 200 mg/dL   Triglycerides 123 <150 mg/dL   HDL 31 (L) >40 mg/dL   Total CHOL/HDL Ratio 4.9 RATIO   VLDL 25 0 - 40 mg/dL   LDL Cholesterol 95 0 - 99 mg/dL    Comment:         Total Cholesterol/HDL:CHD Risk Coronary Heart Disease Risk Table                     Men   Women  1/2 Average Risk   3.4   3.3  Average Risk       5.0   4.4  2 X Average Risk   9.6   7.1  3 X Average Risk  23.4   11.0        Use the calculated Patient Ratio above and the CHD Risk Table to determine the patient's CHD Risk.        ATP III CLASSIFICATION (LDL):  <100     mg/dL   Optimal  100-129  mg/dL   Near or Above                    Optimal  130-159  mg/dL   Borderline  160-189  mg/dL   High  >190     mg/dL   Very High Performed at [facility], [address] [place], [place] [address]   Vitamin B12     Status: None   Collection Time: 02/12/19  7:13 AM  Result Value Ref Range   Vitamin B-12 607 180 - 914 pg/mL    Comment: (NOTE) This assay is not validated for testing neonatal or myeloproliferative syndrome specimens for Vitamin B12 levels. Performed at [facility], [place] [address] [place], [place] [address]   Ammonia     Status: None   Collection Time: 02/12/19  7:13 AM  Result Value Ref Range   Ammonia 25 9 - 35 umol/L    Comment: Performed at [facility], [address] [place], [place] [address]  Glucose, capillary     Status: None   Collection Time: 02/12/19  7:53 AM  Result Value Ref Range   Glucose-Capillary 99 70 - 99 mg/dL  Glucose, capillary     Status: Abnormal   Collection Time: 02/12/19 12:20 PM  Result Value Ref Range   Glucose-Capillary 135 (H) 70 - 99 mg/dL  Glucose, capillary     Status: Abnormal   Collection Time: 02/12/19  4:15 PM  Result Value Ref Range   Glucose-Capillary 142 (H) 70 - 99 mg/dL  Glucose, capillary     Status: Abnormal   Collection Time: 02/12/19  9:07 PM  Result Value Ref Range   Glucose-Capillary 106 (H) 70 - 99 mg/dL   Comment  1 Notify RN   Phosphorus     Status: Abnormal   Collection Time: 02/13/19  6:24 AM  Result Value Ref Range   Phosphorus 4.8 (H) 2.5 - 4.6 mg/dL    Comment:  Performed at [facility], [address] [place], [place] [address]  Glucose, capillary     Status: Abnormal   Collection Time: 02/13/19  7:48 AM  Result Value Ref Range   Glucose-Capillary 147 (H) 70 - 99 mg/dL   Comment 1 Notify RN    Comment 2 Document in Chart     Recent Results (from the past 240 hour(s))  Blood Culture (routine x 2)     Status: None (Preliminary result)   Collection Time: 02/11/19 11:27 AM   Specimen: BLOOD  Result Value Ref Range Status   Specimen Description BLOOD BLOOD LEFT HAND  Final   Special Requests   Final    BOTTLES DRAWN AEROBIC AND ANAEROBIC Blood Culture adequate volume   Culture   Final    NO GROWTH 2 DAYS Performed at [facility], [address], [place], [place] [address]    Report Status PENDING  Incomplete  Blood Culture (routine x 2)     Status: None (Preliminary result)   Collection Time: 02/11/19 11:28 AM   Specimen: BLOOD  Result Value Ref Range Status   Specimen Description BLOOD BLOOD LEFT ARM  Final   Special Requests   Final    BOTTLES DRAWN AEROBIC AND ANAEROBIC Blood Culture adequate volume   Culture   Final    NO GROWTH 2 DAYS Performed at [facility], [address], [place], [place] [address]    Report Status PENDING  Incomplete  Urine culture     Status: Abnormal (Preliminary result)   Collection Time: 02/11/19  1:10 PM   Specimen: In/Out Cath Urine  Result Value Ref Range Status   Specimen Description   Final    IN/OUT CATH URINE Performed at [facility], [address] [place], [place] [address]    Special Requests   Final    NONE Performed at [facility], [address], [place], [place] [address]    Culture (A)  Final    >=100,000 COLONIES/mL ESCHERICHIA COLI SUSCEPTIBILITIES TO FOLLOW Performed at [facility], [place] [address], [place], [place] [address]    Report Status PENDING  Incomplete  SARS Coronavirus 2 by RT PCR (hospital order,  performed in [facility] hospital lab) Nasopharyngeal Nasopharyngeal Swab     Status: None   Collection Time: 02/11/19  1:10 PM   Specimen: Nasopharyngeal Swab  Result Value Ref Range Status   SARS Coronavirus 2 NEGATIVE NEGATIVE Final    Comment: (NOTE) If result is NEGATIVE SARS-CoV-2 target nucleic acids are NOT DETECTED. The SARS-CoV-2 RNA is generally detectable in upper and lower  respiratory specimens during the acute phase of infection. The lowest  concentration of SARS-CoV-2 viral copies this assay can detect is 250  copies / mL. A negative result does not preclude SARS-CoV-2 infection  and should not be used as the sole basis for treatment or other  patient management decisions.  A negative result may occur with  improper specimen collection / handling, submission of specimen other  than nasopharyngeal swab, presence of viral mutation(s) within the  areas targeted by this assay, and inadequate number of viral copies  (<250 copies / mL). A negative result must be combined with clinical  observations, patient history, and epidemiological  information. If result is POSITIVE SARS-CoV-2 target nucleic acids are DETECTED. The SARS-CoV-2 RNA is generally detectable in upper and lower  respiratory specimens dur ing the acute phase of infection.  Positive  results are indicative of active infection with SARS-CoV-2.  Clinical  correlation with patient history and other diagnostic information is  necessary to determine patient infection status.  Positive results do  not rule out bacterial infection or co-infection with other viruses. If result is PRESUMPTIVE POSTIVE SARS-CoV-2 nucleic acids MAY BE PRESENT.   A presumptive positive result was obtained on the submitted specimen  and confirmed on repeat testing.  While 2019 novel coronavirus  (SARS-CoV-2) nucleic acids may be present in the submitted sample  additional confirmatory testing may be necessary for epidemiological  and / or  clinical management purposes  to differentiate between  SARS-CoV-2 and other Sarbecovirus currently known to infect humans.  If clinically indicated additional testing with an alternate test  methodology [phone]) is advised. The SARS-CoV-2 RNA is generally  detectable in upper and lower respiratory sp ecimens during the acute  phase of infection. The expected result is Negative. Fact Sheet for Patients:  StrictlyIdeas.no Fact Sheet for Healthcare Providers: BankingDealers.co.za This test is not yet approved or cleared by the [place] FDA and has been authorized for detection and/or diagnosis of SARS-CoV-2 by FDA under an Emergency Use Authorization (EUA).  This EUA will remain in effect (meaning this test can be used) for the duration of the COVID-19 declaration under Section 564(b)(1) of the Act, 21 U.S.C. section 360bbb-3(b)(1), unless the authorization is terminated or revoked sooner. Performed at [facility], [place], [place] [address]     Ct Head Wo Contrast  Result Date: 02/11/2019 CLINICAL DATA:  Altered level of consciousness. EXAM: CT HEAD WITHOUT CONTRAST TECHNIQUE: Contiguous axial images were obtained from the base of the skull through the vertex without intravenous contrast. COMPARISON:  None. FINDINGS: Brain: Atrophy with sulcal prominence centralized volume loss with commensurate ex vacuo dilatation of the ventricular system. Rather extensive periventricular hypodensities compatible with microvascular ischemic disease. 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 Bilateral basal ganglial calcifications. Given extensive background parenchymal abnormalities, there is no CT evidence of superimposed acute large territory infarct. No intraparenchymal or extra-axial mass or hemorrhage. Normal configuration of the ventricles and the basilar cisterns. No midline shift. Vascular:  Intracranial atherosclerosis. Skull: No displaced calvarial fracture. Sinuses/Orbits: There is underpneumatization of the bilateral frontal sinuses. Small air-fluid level noted within the right sphenoid sinus. The remaining paranasal sinuses and mastoid air cells are normally aerated. Post bilateral cataract surgery with additional postoperative change of the left lobe. Other: Regional soft tissues appear normal. IMPRESSION: Advanced atrophy and microvascular ischemic disease without superimposed acute intracranial process. Electronically Signed   By: Sandi Mariscal M.D.   On: 02/11/2019 12:59   Mr Brain Wo Contrast  Result Date: 02/11/2019 CLINICAL DATA:  Altered mental status. Recent dialysis. Hypertensive. Confusion. EXAM: MRI HEAD WITHOUT CONTRAST TECHNIQUE: Multiplanar, multiecho pulse sequences of the brain and surrounding structures were obtained without intravenous contrast. COMPARISON:  CT head earlier in the day. FINDINGS: Brain: No evidence for acute infarction, hemorrhage, mass lesion, hydrocephalus, or extra-axial fluid. Generalized atrophy. Advanced T2 and FLAIR hyperintensities throughout the white matter, consistent with chronic microvascular ischemic change. Prominent perivascular spaces suggesting longstanding hypertension. 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 Vascular: Flow voids are maintained throughout the carotid,  basilar, and vertebral arteries. There are no areas of chronic hemorrhage. Skull and upper cervical spine: Unremarkable visualized calvarium, skullbase, and cervical vertebrae. Pituitary, pineal, cerebellar tonsils unremarkable. No upper cervical cord lesions. Sinuses/Orbits: No orbital masses or proptosis. Globes appear symmetric. BILATERAL cataract extraction. Sinuses appear well aerated, except for trace layering sphenoid fluid. Other: None. IMPRESSION: Atrophy and small vessel disease. No acute  intracranial findings. Electronically Signed   By: Staci Righter M.D.   On: 02/11/2019 18:22   US Carotid Bilateral (at [facility])  Result Date: 02/11/2019 CLINICAL DATA:  Right-sided weakness EXAM: BILATERAL CAROTID DUPLEX ULTRASOUND TECHNIQUE: Pearline Cables scale imaging, color Doppler and duplex ultrasound were performed of bilateral carotid and vertebral arteries in the neck. COMPARISON:  None. FINDINGS: Criteria: Quantification of carotid stenosis is based on velocity parameters that correlate the residual internal carotid diameter with NASCET-based stenosis levels, using the diameter of the distal internal carotid lumen as the denominator for stenosis measurement. The following velocity measurements were obtained: RIGHT ICA: 105 cm/sec CCA: 90 cm/sec SYSTOLIC ICA/CCA RATIO:  1.2 ECA: 348 cm/sec LEFT ICA: 138 cm/sec CCA: [id number] cm/sec SYSTOLIC ICA/CCA RATIO:  1.2 ECA: 2 318 cm/sec RIGHT CAROTID ARTERY: There is mild intimal thickening of the right CCA without evidence for a high-grade stenosis. There are calcified and noncalcified plaques involving the distal right CCA resulting in around 50% stenosis. There are atherosclerotic plaques involving the right carotid bulb resulting in around 50% stenosis. There is atherosclerotic plaque at the origin of the right ECA resulting in a moderate to high-grade stenosis. There is calcified plaque involving the proximal right ICA resulting in less than 50% stenosis by grayscale and color imaging. There are calcified and noncalcified plaque throughout the remaining portion of the right ICA without evidence for high-grade stenosis. RIGHT VERTEBRAL ARTERY:  Antegrade flow is noted. LEFT CAROTID ARTERY: There is some mild intimal thickening of the left CCA without evidence for high-grade stenosis. There are calcified and noncalcified plaques involving the carotid bulb resulting in greater than 50% stenosis by grayscale and color Doppler imaging. There are atherosclerotic  plaques at the origin of the left ECA resulting in a moderate to high-grade stenosis. There is calcified plaque at the origin of the left ICA resulting in greater than 50% stenosis by grayscale and color imaging. The remaining portions of the left ICA demonstrate mild-to-moderate calcified and noncalcified plaques without evidence for a high-grade stenosis. LEFT VERTEBRAL ARTERY:  Antegrade flow is noted IMPRESSION: 1. Calcified and noncalcified plaque involving the right carotid bulb and right ICA resulting in less than 50% stenosis by both grayscale and color Doppler imaging. 2. 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 3. Elevated peak systolic velocities within both external carotid arteries likely representing a moderate to high-grade stenosis. 4. Antegrade flow within both vertebral arteries. Electronically Signed   By: Constance Holster M.D.   On: 02/11/2019 20:28   Dg Chest Port 1 View  Result Date: 02/11/2019 CLINICAL DATA:  Altered mental status. EXAM: PORTABLE CHEST 1 VIEW COMPARISON:  None. FINDINGS: Normal sized heart. Tortuous and calcified thoracic aorta. Clear lungs. Right axillary stent. Unremarkable bones. IMPRESSION: No acute abnormality. Electronically Signed   By: Claudie Revering M.D.   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  02/13/2019, 10:31 AM

## 2019-02-13 NOTE — Progress Notes (Deleted)
Pre HD Assessment:   02/13/19 0914  Neurological  Level of Consciousness Alert  Orientation Level Oriented X4  Respiratory  Respiratory Pattern Regular;Unlabored;Dyspnea with exertion  Chest Assessment Chest expansion symmetrical  Bilateral Breath Sounds Diminished  Cardiac  Pulse Regular  Heart Sounds S1, S2  Jugular Venous Distention (JVD) No  Cardiac Rhythm Other (Comment) (SVPB rhythm)  Vascular  R Radial Pulse +2  L Radial Pulse +2  Psychosocial  Psychosocial (WDL) WDL

## 2019-02-13 NOTE — NC FL2 (Signed)
[place] LEVEL OF CARE SCREENING TOOL     IDENTIFICATION  Patient Name: Gregory Davenport Birthdate: April 15, 1941 Sex: male Admission Date (Current Location): 02/11/2019  [place] and [place] Number:  Engineering geologist and Address:  [facility], [address] [place], [place] [address]      Provider Number: [id number]  Attending Physician Name and Address:  Vaughan Basta, *  Relative Name and Phone Number:  [name] Daughter   [phone]    Current Level of Care: Hospital Recommended Level of Care: [place] Prior Approval Number:    Date Approved/Denied:   PASRR Number: [id number]  Discharge Plan: SNF    Current Diagnoses: Patient Active Problem List   Diagnosis Date Noted  . Right sided weakness 02/11/2019    Orientation RESPIRATION BLADDER Height & Weight     Self, Time, Situation, Place  Normal Continent Weight: 203 lb 0.7 oz (92.1 kg) Height:  6' (182.9 cm)  BEHAVIORAL SYMPTOMS/MOOD NEUROLOGICAL BOWEL NUTRITION STATUS      Continent Diet(Renal Carb diet)  AMBULATORY STATUS COMMUNICATION OF NEEDS Skin   Limited Assist Verbally Normal                       Personal Care Assistance Level of Assistance  Bathing, Dressing, Feeding Bathing Assistance: Limited assistance Feeding assistance: Limited assistance Dressing Assistance: Limited assistance     Functional Limitations Info  Sight, Hearing, Speech Sight Info: Impaired Hearing Info: Adequate Speech Info: Adequate    SPECIAL CARE FACTORS FREQUENCY  PT (By licensed PT), OT (By licensed OT)     PT Frequency: Minimum 5x a week OT Frequency: Minimum 5x a week            Contractures Contractures Info: Not present    Additional Factors Info  Code Status, Allergies, Insulin Sliding Scale Code Status Info: Full Code Allergies Info: Other Combative with opioid medications   Insulin Sliding Scale Info: insulin aspart  (novoLOG) injection 0-9 Units 3x a day with meals.       Current Medications (02/13/2019):  This is the current hospital active medication list Current Facility-Administered Medications  Medication Dose Route Frequency Provider Last Rate Last Dose  .  stroke: mapping our early stages of recovery book   Does not apply Once Loletha Grayer, MD      . 0.9 %  sodium chloride infusion   Intravenous PRN Loletha Grayer, MD   Stopped at 02/12/19 1640  . acetaminophen (TYLENOL) tablet 650 mg  650 mg Oral Q6H PRN Lang Snow, NP   650 mg at 02/11/19 2250  . amLODipine (NORVASC) tablet 10 mg  10 mg Oral Daily Loletha Grayer, MD   10 mg at 02/13/19 1508  . aspirin EC tablet 81 mg  81 mg Oral Daily Loletha Grayer, MD   81 mg at 02/13/19 1508  . atorvastatin (LIPITOR) tablet 40 mg  40 mg Oral q1800 Loletha Grayer, MD   40 mg at 02/13/19 1657  . brimonidine (ALPHAGAN) 0.2 % ophthalmic solution 1 drop  1 drop Right Eye BID Loletha Grayer, MD   1 drop at 02/12/19 2200  . carvedilol (COREG) tablet 12.5 mg  12.5 mg Oral BID Loletha Grayer, MD   12.5 mg at 02/13/19 1509  . cefTRIAXone (ROCEPHIN) 1 g in sodium chloride 0.9 % 100 mL IVPB  1 g Intravenous Q24H Loletha Grayer, MD 200 mL/hr at 02/13/19 1517 1 g at 02/13/19 1517  . Chlorhexidine  Gluconate Cloth 2 % PADS 6 each  6 each Topical Q0600 Murlean Iba, MD   6 each at 02/13/19 0523  . dorzolamide-timolol (COSOPT) 22.3-6.8 MG/ML ophthalmic solution 1 drop  1 drop Both Eyes BID Loletha Grayer, MD   1 drop at 02/12/19 2200  . gabapentin (NEURONTIN) capsule 100 mg  100 mg Oral QHS Loletha Grayer, MD   100 mg at 02/12/19 2148  . heparin injection 5,000 Units  5,000 Units Subcutaneous Q8H Loletha Grayer, MD   5,000 Units at 02/13/19 0524  . insulin aspart (novoLOG) injection 0-5 Units  0-5 Units Subcutaneous QHS Wieting, Richard, MD      . insulin aspart (novoLOG) injection 0-9 Units  0-9 Units Subcutaneous TID WC Loletha Grayer,  MD   2 Units at 02/13/19 1704  . insulin glargine (LANTUS) injection 8 Units  8 Units Subcutaneous QHS Loletha Grayer, MD   8 Units at 02/11/19 2246  . pantoprazole (PROTONIX) EC tablet 40 mg  40 mg Oral Daily Loletha Grayer, MD   40 mg at 02/13/19 1509  . tamsulosin (FLOMAX) capsule 0.4 mg  0.4 mg Oral Daily Loletha Grayer, MD   0.4 mg at 02/13/19 1509     Discharge Medications: Please see discharge summary for a list of discharge medications.  Relevant Imaging Results:  Relevant Lab Results:   Additional Information SSN [id number]  Ross Ludwig, LCSW

## 2019-02-13 NOTE — Progress Notes (Signed)
HD Tx Completed:    02/13/19 1310  Vital Signs  Temp 97.9 F (36.6 C)  Temp Source Oral  Pulse Rate 62  Pulse Rate Source Dinamap  Resp 16  BP (!) 163/75  BP Location Left Arm  BP Method Automatic  Patient Position (if appropriate) Lying  Oxygen Therapy  SpO2 100 %  O2 Device Room Air  Pain Assessment  Pain Scale 0-10  Pain Score 0  During Hemodialysis Assessment  KECN 76.5 KECN  Dialysis Fluid Bolus Normal Saline  Bolus Amount (mL) 250 mL  Intra-Hemodialysis Comments Tx completed;Tolerated well

## 2019-02-13 NOTE — Progress Notes (Signed)
[facility]  ROUNDING NOTE   Subjective:  Patient seen and evaluated during hemodialysis. Tolerating well. No acute complaints at the moment.   Objective:  Vital signs in last 24 hours:  Temp:  [98 F (36.7 C)-99.4 F (37.4 C)] 98.6 F (37 C) (10/12 0915) Pulse Rate:  [64-76] 66 (10/12 1100) Resp:  [16-19] 16 (10/12 1100) BP: (117-163)/(53-73) 163/73 (10/12 1100) SpO2:  [95 %-100 %] 100 % (10/12 1100) Weight:  [92 kg] 92 kg (10/12 0915)  Weight change:  Filed Weights   02/11/19 1940 02/13/19 0914 02/13/19 0915  Weight: 91.9 kg 92 kg 92 kg    Intake/Output: I/O last 3 completed shifts: In: 603.5 [P.O.:480; I.V.:23.5; IV Piggyback:100] Out: 0    Intake/Output this shift:  No intake/output data recorded.  Physical Exam: General: No acute distress  Head: Normocephalic, atraumatic. Moist oral mucosal membranes  Eyes: Anicteric  Neck: Supple, trachea midline  Lungs:  Clear to auscultation, normal effort  Heart: S1S2 no rubs  Abdomen:  Soft, nontender, bowel sounds present  Extremities: Trace peripheral edema.  Neurologic: Awake, alert, following commands  Skin: No lesions  Access: Right upper extremity AV graft    Basic Metabolic Panel: Recent Labs  Lab 02/11/19 1127 02/12/19 0713 02/13/19 0624  NA 136 138  --   K 3.6 3.7  --   CL 94* 96*  --   CO2 29 28  --   GLUCOSE 107* 116*  --   BUN 25* 33*  --   CREATININE 7.57* 9.29*  --   CALCIUM 9.1 9.0  --   PHOS  --   --  4.8*    Liver Function Tests: Recent Labs  Lab 02/11/19 1127  AST 18  ALT 15  ALKPHOS 58  BILITOT 0.8  PROT 7.3  ALBUMIN 3.6   No results for input(s): LIPASE, AMYLASE in the last 168 hours. Recent Labs  Lab 02/12/19 0713  AMMONIA 25    CBC: Recent Labs  Lab 02/11/19 1127 02/12/19 0713  WBC 7.4 6.2  NEUTROABS 4.0  --   HGB 11.2* 11.8*  HCT 35.8* 37.1*  MCV 100.3* 99.7  PLT 168 181    Cardiac Enzymes: No results for input(s): CKTOTAL, CKMB, CKMBINDEX,  TROPONINI in the last 168 hours.  BNP: Invalid input(s): POCBNP  CBG: Recent Labs  Lab 02/12/19 0753 02/12/19 1220 02/12/19 1615 02/12/19 2107 02/13/19 0748  GLUCAP 99 135* 142* 106* 147*    Microbiology: Results for orders placed or performed during the hospital encounter of 02/11/19  Blood Culture (routine x 2)     Status: None (Preliminary result)   Collection Time: 02/11/19 11:27 AM   Specimen: BLOOD  Result Value Ref Range Status   Specimen Description BLOOD BLOOD LEFT HAND  Final   Special Requests   Final    BOTTLES DRAWN AEROBIC AND ANAEROBIC Blood Culture adequate volume   Culture   Final    NO GROWTH 2 DAYS Performed at [facility], [address], [place], [place] [address]    Report Status PENDING  Incomplete  Blood Culture (routine x 2)     Status: None (Preliminary result)   Collection Time: 02/11/19 11:28 AM   Specimen: BLOOD  Result Value Ref Range Status   Specimen Description BLOOD BLOOD LEFT ARM  Final   Special Requests   Final    BOTTLES DRAWN AEROBIC AND ANAEROBIC Blood Culture adequate volume   Culture   Final    NO GROWTH 2  DAYS Performed at [facility], [address] [place], [place] [address]    Report Status PENDING  Incomplete  Urine culture     Status: Abnormal (Preliminary result)   Collection Time: 02/11/19  1:10 PM   Specimen: In/Out Cath Urine  Result Value Ref Range Status   Specimen Description   Final    IN/OUT CATH URINE Performed at [facility], [address] [place], [place] [address]    Special Requests   Final    NONE Performed at [facility], [address], [place], [place] [address]    Culture (A)  Final    >=100,000 COLONIES/mL ESCHERICHIA COLI SUSCEPTIBILITIES TO FOLLOW Performed at [facility], [place] [address], [place], [place] [address]    Report Status PENDING  Incomplete  SARS Coronavirus 2 by RT PCR (hospital order, performed in [facility] hospital lab)  Nasopharyngeal Nasopharyngeal Swab     Status: None   Collection Time: 02/11/19  1:10 PM   Specimen: Nasopharyngeal Swab  Result Value Ref Range Status   SARS Coronavirus 2 NEGATIVE NEGATIVE Final    Comment: (NOTE) If result is NEGATIVE SARS-CoV-2 target nucleic acids are NOT DETECTED. The SARS-CoV-2 RNA is generally detectable in upper and lower  respiratory specimens during the acute phase of infection. The lowest  concentration of SARS-CoV-2 viral copies this assay can detect is 250  copies / mL. A negative result does not preclude SARS-CoV-2 infection  and should not be used as the sole basis for treatment or other  patient management decisions.  A negative result may occur with  improper specimen collection / handling, submission of specimen other  than nasopharyngeal swab, presence of viral mutation(s) within the  areas targeted by this assay, and inadequate number of viral copies  (<250 copies / mL). A negative result must be combined with clinical  observations, patient history, and epidemiological information. If result is POSITIVE SARS-CoV-2 target nucleic acids are DETECTED. The SARS-CoV-2 RNA is generally detectable in upper and lower  respiratory specimens dur ing the acute phase of infection.  Positive  results are indicative of active infection with SARS-CoV-2.  Clinical  correlation with patient history and other diagnostic information is  necessary to determine patient infection status.  Positive results do  not rule out bacterial infection or co-infection with other viruses. If result is PRESUMPTIVE POSTIVE SARS-CoV-2 nucleic acids MAY BE PRESENT.   A presumptive positive result was obtained on the submitted specimen  and confirmed on repeat testing.  While 2019 novel coronavirus  (SARS-CoV-2) nucleic acids may be present in the submitted sample  additional confirmatory testing may be necessary for epidemiological  and / or clinical management purposes  to  differentiate between  SARS-CoV-2 and other Sarbecovirus currently known to infect humans.  If clinically indicated additional testing with an alternate test  methodology [phone]) is advised. The SARS-CoV-2 RNA is generally  detectable in upper and lower respiratory sp ecimens during the acute  phase of infection. The expected result is Negative. Fact Sheet for Patients:  StrictlyIdeas.no Fact Sheet for Healthcare Providers: BankingDealers.co.za This test is not yet approved or cleared by the [place] FDA and has been authorized for detection and/or diagnosis of SARS-CoV-2 by FDA under an Emergency Use Authorization (EUA).  This EUA will remain in effect (meaning this test can be used) for the duration of the COVID-19 declaration under Section 564(b)(1) of the Act, 21 U.S.C. section 360bbb-3(b)(1), unless the authorization is terminated or  revoked sooner. Performed at [facility], [address] [place],  [address]     Coagulation Studies: Recent Labs    02/11/19 1127  LABPROT 13.1  INR 1.0    Urinalysis: Recent Labs    02/11/19 1310  COLORURINE AMBER*  LABSPEC 1.013  PHURINE 6.0  GLUCOSEU NEGATIVE  HGBUR MODERATE*  BILIRUBINUR NEGATIVE  KETONESUR NEGATIVE  PROTEINUR 100*  NITRITE NEGATIVE  LEUKOCYTESUR LARGE*      Imaging: Ct Head Wo Contrast  Result Date: 02/11/2019 CLINICAL DATA:  Altered level of consciousness. EXAM: CT HEAD WITHOUT CONTRAST TECHNIQUE: Contiguous axial images were obtained from the base of the skull through the vertex without intravenous contrast. COMPARISON:  None. FINDINGS: Brain: Atrophy with sulcal prominence centralized volume loss with commensurate ex vacuo dilatation of the ventricular system. Rather extensive periventricular hypodensities compatible with microvascular ischemic disease. 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 Bilateral basal  ganglial calcifications. Given extensive background parenchymal abnormalities, there is no CT evidence of superimposed acute large territory infarct. No intraparenchymal or extra-axial mass or hemorrhage. Normal configuration of the ventricles and the basilar cisterns. No midline shift. Vascular: Intracranial atherosclerosis. Skull: No displaced calvarial fracture. Sinuses/Orbits: There is underpneumatization of the bilateral frontal sinuses. Small air-fluid level noted within the right sphenoid sinus. The remaining paranasal sinuses and mastoid air cells are normally aerated. Post bilateral cataract surgery with additional postoperative change of the left lobe. Other: Regional soft tissues appear normal. IMPRESSION: Advanced atrophy and microvascular ischemic disease without superimposed acute intracranial process. Electronically Signed   By: Sandi Mariscal M.D.   On: 02/11/2019 12:59   Mr Brain Wo Contrast  Result Date: 02/11/2019 CLINICAL DATA:  Altered mental status. Recent dialysis. Hypertensive. Confusion. EXAM: MRI HEAD WITHOUT CONTRAST TECHNIQUE: Multiplanar, multiecho pulse sequences of the brain and surrounding structures were obtained without intravenous contrast. COMPARISON:  CT head earlier in the day. FINDINGS: Brain: No evidence for acute infarction, hemorrhage, mass lesion, hydrocephalus, or extra-axial fluid. Generalized atrophy. Advanced T2 and FLAIR hyperintensities throughout the white matter, consistent with chronic microvascular ischemic change. Prominent perivascular spaces suggesting longstanding hypertension. 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 Vascular: Flow voids are maintained throughout the carotid, basilar, and vertebral arteries. There are no areas of chronic hemorrhage. Skull and upper cervical spine: Unremarkable visualized calvarium, skullbase, and cervical vertebrae. Pituitary, pineal, cerebellar  tonsils unremarkable. No upper cervical cord lesions. Sinuses/Orbits: No orbital masses or proptosis. Globes appear symmetric. BILATERAL cataract extraction. Sinuses appear well aerated, except for trace layering sphenoid fluid. Other: None. IMPRESSION: Atrophy and small vessel disease. No acute intracranial findings. Electronically Signed   By: Staci Righter M.D.   On: 02/11/2019 18:22   US Carotid Bilateral (at [facility] Only)  Result Date: 02/11/2019 CLINICAL DATA:  Right-sided weakness EXAM: BILATERAL CAROTID DUPLEX ULTRASOUND TECHNIQUE: Pearline Cables scale imaging, color Doppler and duplex ultrasound were performed of bilateral carotid and vertebral arteries in the neck. COMPARISON:  None. FINDINGS: Criteria: Quantification of carotid stenosis is based on velocity parameters that correlate the residual internal carotid diameter with NASCET-based stenosis levels, using the diameter of the distal internal carotid lumen as the denominator for stenosis measurement. The following velocity measurements were obtained: RIGHT ICA: 105 cm/sec CCA: 90 cm/sec SYSTOLIC ICA/CCA RATIO:  1.2 ECA: 348 cm/sec LEFT ICA: 138 cm/sec CCA: [id number] cm/sec SYSTOLIC ICA/CCA RATIO:  1.2 ECA: 2 318 cm/sec RIGHT CAROTID ARTERY: There is mild intimal  thickening of the right CCA without evidence for a high-grade stenosis. There are calcified and noncalcified plaques involving the distal right CCA resulting in around 50% stenosis. There are atherosclerotic plaques involving the right carotid bulb resulting in around 50% stenosis. There is atherosclerotic plaque at the origin of the right ECA resulting in a moderate to high-grade stenosis. There is calcified plaque involving the proximal right ICA resulting in less than 50% stenosis by grayscale and color imaging. There are calcified and noncalcified plaque throughout the remaining portion of the right ICA without evidence for high-grade stenosis. RIGHT VERTEBRAL ARTERY:  Antegrade flow is noted.  LEFT CAROTID ARTERY: There is some mild intimal thickening of the left CCA without evidence for high-grade stenosis. There are calcified and noncalcified plaques involving the carotid bulb resulting in greater than 50% stenosis by grayscale and color Doppler imaging. There are atherosclerotic plaques at the origin of the left ECA resulting in a moderate to high-grade stenosis. There is calcified plaque at the origin of the left ICA resulting in greater than 50% stenosis by grayscale and color imaging. The remaining portions of the left ICA demonstrate mild-to-moderate calcified and noncalcified plaques without evidence for a high-grade stenosis. LEFT VERTEBRAL ARTERY:  Antegrade flow is noted IMPRESSION: 1. Calcified and noncalcified plaque involving the right carotid bulb and right ICA resulting in less than 50% stenosis by both grayscale and color Doppler imaging. 2. 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 3. Elevated peak systolic velocities within both external carotid arteries likely representing a moderate to high-grade stenosis. 4. Antegrade flow within both vertebral arteries. Electronically Signed   By: Constance Holster M.D.   On: 02/11/2019 20:28   Dg Chest Port 1 View  Result Date: 02/11/2019 CLINICAL DATA:  Altered mental status. EXAM: PORTABLE CHEST 1 VIEW COMPARISON:  None. FINDINGS: Normal sized heart. Tortuous and calcified thoracic aorta. Clear lungs. Right axillary stent. Unremarkable bones. IMPRESSION: No acute abnormality. Electronically Signed   By: Claudie Revering M.D.   On: 02/11/2019 11:48     Medications:   . sodium chloride Stopped (02/12/19 1640)  . cefTRIAXone (ROCEPHIN)  IV Stopped (02/12/19 1619)   .  stroke: mapping our early stages of recovery book   Does not apply Once  . amLODipine  10 mg Oral Daily  . aspirin EC  81 mg Oral Daily  . atorvastatin  40 mg Oral q1800  . brimonidine  1 drop Right Eye BID  . carvedilol  12.5 mg Oral BID  . Chlorhexidine Gluconate Cloth  6  each Topical Q0600  . dorzolamide-timolol  1 drop Both Eyes BID  . gabapentin  100 mg Oral QHS  . heparin injection (subcutaneous)  5,000 Units Subcutaneous Q8H  . insulin aspart  0-5 Units Subcutaneous QHS  . insulin aspart  0-9 Units Subcutaneous TID WC  . insulin glargine  8 Units Subcutaneous QHS  . pantoprazole  40 mg Oral Daily  . tamsulosin  0.4 mg Oral Daily   sodium chloride, acetaminophen, heparin  Assessment/ Plan:  78 y.o. male  with Diabetes, HTN, ESRD, CAD was admitted on 02/11/2019 with generalized weakness, confusion and fever.   1.  ESRD on HD MWF followed by [facility] nephrology. Patient undergoes dialysis at [facility].  Seen and evaluated during dialysis treatment today.  Tolerating well.  We plan to complete dialysis treatment today.  2.  Anemia of chronic kidney disease.  Hemoglobin 11.8.  Hold off on Epogen at this time.  3.  Secondary hyperparathyroidism.  Most recent phosphorus was 4.8.  Patient not on binder therapy at the moment.  Hold off on adding binders.  4.  Hypertension.  Maintain the patient on current doses of amlodipine and carvedilol.    LOS: 2 Dekayla Prestridge 10/12/202011:13 AM

## 2019-02-13 NOTE — Progress Notes (Signed)
HD Tx Initiated:    02/13/19 0915  Vital Signs  Temp 98.6 F (37 C)  Temp Source Oral  Pulse Rate 70  Pulse Rate Source Dinamap  Resp 16  BP (!) 141/61  BP Location Left Arm  BP Method Automatic  Patient Position (if appropriate) Lying  Oxygen Therapy  SpO2 99 %  O2 Device Room Air  Pain Assessment  Pain Scale 0-10  Pain Score 0  Dialysis Weight  Weight 92 kg  Type of Weight Pre-Dialysis  During Hemodialysis Assessment  Blood Flow Rate (mL/min) 400 mL/min  Arterial Pressure (mmHg) -150 mmHg  Venous Pressure (mmHg) 220 mmHg  Transmembrane Pressure (mmHg) 40 mmHg  Ultrafiltration Rate (mL/min) 430 mL/min  Dialysate Flow Rate (mL/min) 600 ml/min  Conductivity: Machine  14.1  HD Safety Checks Performed Yes  Intra-Hemodialysis Comments Tx initiated

## 2019-02-13 NOTE — Progress Notes (Signed)
Pre HD Assessment:    02/13/19 0914  Neurological  Level of Consciousness Alert  Orientation Level Oriented X4  Respiratory  Respiratory Pattern Regular;Unlabored;Dyspnea with exertion  Chest Assessment Chest expansion symmetrical  Bilateral Breath Sounds Diminished  Cardiac  Pulse Regular  Heart Sounds S1, S2  Jugular Venous Distention (JVD) No  Cardiac Rhythm Other (Comment) (SVPB rhythm)  Vascular  R Radial Pulse +2  L Radial Pulse +2  Psychosocial  Psychosocial (WDL) WDL

## 2019-02-13 NOTE — Progress Notes (Signed)
Established hemodialysis patient known at [facility] MWF 12:35, patient transports with [facility]. Per patient, he lives during the week with one daughter in [place] and the another daughter in [place] on the weekends. Please contact me directly with any dialysis placement concerns.  Elvera Bicker Dialysis Coordinator  [phone]

## 2019-02-13 NOTE — Progress Notes (Signed)
OT Cancellation Note  Patient Details Name: Shahrukh Horrocks MRN: [id number] DOB: [birth year]   Cancelled Treatment:    Reason Eval/Treat Not Completed: Patient at procedure or test/ unavailable. Consult received, chart reviewed. Pt currently unavailable, receiving HD. Will re-attempt at later date/time as pt is medically appropriate and available.  Jeni Salles, MPH, MS, OTR/L ascom [phone] 02/13/19, 9:40 AM

## 2019-02-13 NOTE — Plan of Care (Signed)
  Problem: Education: Goal: Knowledge of General Education information will improve Description: Including pain rating scale, medication(s)/side effects and non-pharmacologic comfort measures Outcome: Progressing   Problem: Health Behavior/Discharge Planning: Goal: Ability to manage health-related needs will improve Outcome: Progressing   Problem: Clinical Measurements: Goal: Ability to maintain clinical measurements within normal limits will improve Outcome: Not Progressing Note: Patient's creatinine level most recently = elevated at 9.3. Patient is already an HD patient, and he already went for a session today. One liter of fluid removed. Will continue to monitor overall progression. Wenda Low [facility]

## 2019-02-13 NOTE — Progress Notes (Signed)
Post HD Tx:    02/13/19 1315  Vital Signs  Temp 97.9 F (36.6 C)  Temp Source Oral  Pulse Rate 67  Pulse Rate Source Dinamap  Resp 16  BP (!) 177/70  BP Location Left Arm  BP Method Automatic  Patient Position (if appropriate) Lying  Oxygen Therapy  SpO2 100 %  O2 Device Room Air  Pain Assessment  Pain Scale 0-10  Pain Score 0  Dialysis Weight  Weight 92.1 kg  Type of Weight Post-Dialysis  Post-Hemodialysis Assessment  Rinseback Volume (mL) 250 mL  KECN 76.5 V  Dialyzer Clearance Lightly streaked  Duration of HD Treatment -hour(s) 3.5 hour(s)  Hemodialysis Intake (mL) 500 mL  UF Total -Machine (mL) 1500 mL  Net UF (mL) 1000 mL  Tolerated HD Treatment Yes  Education / Care Plan  Dialysis Education Provided Yes  Documented Education in Care Plan Yes  Outpatient Plan of Care Reviewed and on Chart Yes  Fistula / Graft  No Placement Date or Time found.   Placed prior to admission: Yes  Site Condition No complications  Fistula / Graft Assessment Bruit;Thrill;Present  Status Deaccessed  Needle Size 15  Drainage Description None

## 2019-02-14 LAB — URINE CULTURE: Culture: >=100000 — AB

## 2019-02-14 LAB — GLUCOSE, CAPILLARY
Glucose-Capillary: 106 mg/dL — ABNORMAL HIGH (ref 70–99)
Glucose-Capillary: 179 mg/dL — ABNORMAL HIGH (ref 70–99)
Glucose-Capillary: 122 mg/dL — ABNORMAL HIGH (ref 70–99)
Glucose-Capillary: 141 mg/dL — ABNORMAL HIGH (ref 70–99)

## 2019-02-14 LAB — SARS CORONAVIRUS 2 (TAT 6-24 HRS): SARS Coronavirus 2: NEGATIVE

## 2019-02-14 MED ORDER — POLYETHYLENE GLYCOL 3350 17 G PO PACK
17.0000 g | PACK | Freq: Every day | ORAL | Status: DC
  Administered 2019-02-14 – 2019-02-17 (×4): 17 g via ORAL
  Filled 2019-02-14 (×4): qty 1

## 2019-02-14 MED ORDER — SODIUM CHLORIDE 0.9 % IV SOLN
500.0000 mg | INTRAVENOUS | Status: DC
  Administered 2019-02-14 – 2019-02-15 (×2): 500 mg via INTRAVENOUS
  Filled 2019-02-14 (×3): qty 0.5

## 2019-02-14 NOTE — Evaluation (Signed)
Occupational Therapy Evaluation Patient Details Name: Gregory Davenport MRN: [id number] DOB: 1940-07-03 Today's Date: 02/14/2019    History of Present Illness From MD H&P: Pt is an 78 y.o. male with a known history of end-stage renal disease.  He states that he feels terrible. After dialysis yesterday he felt a little bit weak.  He did have some confusion.  She felt that his right side was weaker. Today they could not even transfer him.  Normally is able to walk a little bit with a walker.  Urine analysis looked positive. Patient also having right-sided weakness and can hardly lift his right leg up off the bed and his grip strength is a little weaker with his right arm.  Hospitalist services contacted for further evaluation.  Per MD assessment CT and MRI both (-) for CVA.  Assessment includes: Right-sided weakness, fever with normal white count, ESRD on HD M,W,F, DM II, and HTN.   Clinical Impression   Pt seen for OT evaluation this date. Prior to hospital admission, pt was living with his daughter who assisted him with ADL and mobility as needed 2/2 low vision.  Currently pt demonstrates impairments in BLE strength, balance, and with existing low vision deficits pt now requires increased assist for ADL and functional mobility to maximize safety. Pt required cues for hand/foot placement prior to functional transfers, CGA to Min A for RW mgt during functional mobility, and set up and directional verbal/tactile cues for breakfast. Pt would benefit from skilled OT to address noted impairments and functional limitations (see below for any additional details) in order to maximize safety and independence while minimizing falls risk and caregiver burden.  Upon hospital discharge, recommend pt discharge to SNF.    Follow Up Recommendations  SNF    Equipment Recommendations  Other (comment)(low vision tools/strategies at next venue of care)    Recommendations for Other Services       Precautions /  Restrictions Precautions Precautions: Fall Restrictions Weight Bearing Restrictions: No Other Position/Activity Restrictions: Pt is legally blind      Mobility Bed Mobility Overal bed mobility: Needs Assistance Bed Mobility: Supine to Sit     Supine to sit: Modified independent (Device/Increase time)        Transfers Overall transfer level: Needs assistance Equipment used: Rolling walker (2 wheeled) Transfers: Sit to/from Stand Sit to Stand: Min assist              Balance Overall balance assessment: Needs assistance Sitting-balance support: Single extremity supported;Feet supported Sitting balance-Leahy Scale: Good     Standing balance support: Bilateral upper extremity supported Standing balance-Leahy Scale: Fair                             ADL either performed or assessed with clinical judgement   ADL                                         General ADL Comments: Supervision and set up for seated ADL, CGA for functional ADL transfers     Vision Baseline Vision/History: Legally blind Patient Visual Report: No change from baseline Additional Comments: Pt is legally blind     Perception     Praxis      Pertinent Vitals/Pain Pain Assessment: No/denies pain     Hand Dominance Right   Extremity/Trunk Assessment Upper Extremity Assessment Upper  Extremity Assessment: Overall WFL for tasks assessed   Lower Extremity Assessment Lower Extremity Assessment: Generalized weakness       Communication Communication Communication: No difficulties;Other (comment)(Pt reports being legally blind)   Cognition Arousal/Alertness: Awake/alert Behavior During Therapy: WFL for tasks assessed/performed Overall Cognitive Status: Within Functional Limits for tasks assessed                                     General Comments       Exercises Other Exercises Other Exercises: Set up and directional/spatial cues for meal  time, pt primarily using tactile cues to locate items on tray   Shoulder Instructions      Home Living Family/patient expects to be discharged to:: Private residence Living Arrangements: Children(Lives with two daughters) Available Help at Discharge: Family;Available PRN/intermittently Type of Home: House Home Access: Stairs to enter [employer] of Steps: 3 Entrance Stairs-Rails: Right;Left;Can reach both Home Layout: One level     Bathroom Shower/Tub: Teacher, early years/pre: Standard     Home Equipment: Shower seat - built in;Cane - single point;Wheelchair - [employer] - 2 wheels          Prior Functioning/Environment Level of Independence: Independent with assistive device(s)        Comments: Pt Mod Ind with amb HH distances with a SPC and uses his w/c in the community; pt stated that he is legally blind but is able to ambulate without assistance in the home due to being familiar with the layout; 3 falls in the last year secondary to LOB        OT Problem List: Decreased strength;Decreased safety awareness;Impaired balance (sitting and/or standing);Impaired vision/perception      OT Treatment/Interventions: Self-care/ADL training;Therapeutic exercise;Therapeutic activities;Visual/perceptual remediation/compensation;DME and/or AE instruction;Patient/family education;Balance training    OT Goals(Current goals can be found in the care plan section) Acute Rehab OT Goals Patient Stated Goal: go home OT Goal Formulation: With patient Time For Goal Achievement: 02/28/19 Potential to Achieve Goals: Good ADL Goals Pt Will Transfer to Toilet: with supervision;ambulating(LRAD for amb, BSC over toilet, directional VC for low vision) Additional ADL Goal #1: Pt/spouse will utilize learned low vision strategies such as high contrast set up for meals so pt can self feed with remote supervision and occasional verbal cues for direction.  OT Frequency: Min  1X/week   Barriers to D/C:            Co-evaluation              AM-PAC OT "6 Clicks" Daily Activity     Outcome Measure Help from another person eating meals?: A Little Help from another person taking care of personal grooming?: A Little Help from another person toileting, which includes using toliet, bedpan, or urinal?: A Little Help from another person bathing (including washing, rinsing, drying)?: A Little Help from another person to put on and taking off regular upper body clothing?: A Little Help from another person to put on and taking off regular lower body clothing?: A Little 6 Click Score: 18   End of Session Equipment Utilized During Treatment: Gait belt;Rolling walker  Activity Tolerance: Patient tolerated treatment well Patient left: in chair;with call bell/phone within reach;with chair alarm set  OT Visit Diagnosis: Muscle weakness (generalized) (M62.81);Low vision, both eyes (H54.2)                Time: [id number] OT Time Calculation (  min): 20 min Charges:  OT General Charges $OT Visit: 1 Visit OT Evaluation $OT Eval Low Complexity: 1 Low OT Treatments $Self Care/Home Management : 8-22 mins  Jeni Salles, MPH, MS, OTR/L ascom [phone] 02/14/19, 10:19 AM

## 2019-02-14 NOTE — Progress Notes (Signed)
Patient complaining of constipation. Has not had a bowel movement in 3 days. Dr. Anselm Jungling notified, verbal orders to administer 17g Miralax daily. Will administer and continue to monitor.

## 2019-02-14 NOTE — Progress Notes (Signed)
Contacted by Randall Hiss SW that this patient will need to transfer to a dialysis center near by. Called and confirmed with Awanya (Daughter) that patient should transfer to [facility]. Started referral process.  Elvera Bicker Dialysis Coordinator  [phone]

## 2019-02-14 NOTE — Progress Notes (Signed)
Physical Therapy Treatment Patient Details Name: Gregory Davenport MRN: [id number] DOB: 1940-08-08 Today's Date: 02/14/2019    History of Present Illness From MD H&P: Pt is an 78 y.o. male with a known history of end-stage renal disease.  He states that he feels terrible. After dialysis yesterday he felt a little bit weak.  He did have some confusion.  She felt that his right side was weaker. Today they could not even transfer him.  Normally is able to walk a little bit with a walker.  Urine analysis looked positive. Patient also having right-sided weakness and can hardly lift his right leg up off the bed and his grip strength is a little weaker with his right arm.  Hospitalist services contacted for further evaluation.  Per MD assessment CT and MRI both (-) for CVA.  Assessment includes: Right-sided weakness, fever with normal white count, ESRD on HD M,W,F, DM II, and HTN.    PT Comments    Pt presented with deficits in strength, transfers, mobility, gait, balance, and activity tolerance but made good progress towards goals.  Pt required min A and cues for hand placement to stand.  Pt presented with min posterior instability upon initial stand that was corrected with min A.  Pt was able to amb 4 x 10' with a RW and min A to guide the RW with cues for upright posture and amb closer to the RW.  Pt's RLE continues to buckle minimally occasionally during RLE single leg stance phase while walking but pt is able to self-correct.  Pt will benefit from PT services in a SNF setting upon discharge to safely address above deficits for decreased caregiver assistance and eventual return to PLOF.     Follow Up Recommendations  SNF     Equipment Recommendations  None recommended by PT    Recommendations for Other Services       Precautions / Restrictions Precautions Precautions: Fall Restrictions Weight Bearing Restrictions: No Other Position/Activity Restrictions: Pt is legally blind    Mobility  Bed  Mobility               General bed mobility comments: NT, pt in recliner  Transfers Overall transfer level: Needs assistance Equipment used: Rolling walker (2 wheeled) Transfers: Sit to/from Stand Sit to Stand: Min assist         General transfer comment: Min A and min verbal and tactile cues for sequencing for proper hand placement with transfers  Ambulation/Gait Ambulation/Gait assistance: Min assist Gait Distance (Feet): 10 Feet x 4 Assistive device: Rolling walker (2 wheeled) Gait Pattern/deviations: Step-through pattern;Decreased step length - right;Decreased step length - left;Trunk flexed Gait velocity: decreased   General Gait Details: Min A to guide the RW with verbal cues for general sequencing including upright posture and amb closer to the [employer]    Modified Rankin (Stroke Patients Only)       Balance Overall balance assessment: Needs assistance Sitting-balance support: Single extremity supported;Feet supported Sitting balance-Leahy Scale: Good     Standing balance support: Bilateral upper extremity supported Standing balance-Leahy Scale: Fair Standing balance comment: Mod lean on the RW for support                            Cognition Arousal/Alertness: Awake/alert Behavior During Therapy: WFL for tasks assessed/performed Overall Cognitive Status: Within Functional Limits for tasks  assessed                                        Exercises Total Joint Exercises Ankle Circles/Pumps: Both;10 reps;Strengthening;15 reps Quad Sets: Strengthening;Both;10 reps Gluteal Sets: Strengthening;Both;10 reps Long Arc Quad: Strengthening;Both;10 reps;15 reps Knee Flexion: Strengthening;Both;10 reps;15 reps Marching in Standing: AROM;Strengthening;Both;10 reps;Seated;Standing Other Exercises Other Exercises: Multiple sit to/from stand transfer training with cues for hand placement     General Comments        Pertinent Vitals/Pain Pain Assessment: No/denies pain    Home Living                      Prior Function            PT Goals (current goals can now be found in the care plan section) Progress towards PT goals: Progressing toward goals    Frequency    Min 2X/week      PT Plan Current plan remains appropriate    Co-evaluation              AM-PAC PT "6 Clicks" Mobility   Outcome Measure  Help needed turning from your back to your side while in a flat bed without using bedrails?: A Little Help needed moving from lying on your back to sitting on the side of a flat bed without using bedrails?: A Little Help needed moving to and from a bed to a chair (including a wheelchair)?: A Little Help needed standing up from a chair using your arms (e.g., wheelchair or bedside chair)?: A Little Help needed to walk in hospital room?: A Little Help needed climbing 3-5 steps with a railing? : A Lot 6 Click Score: 17    End of Session Equipment Utilized During Treatment: Gait belt Activity Tolerance: Patient tolerated treatment well Patient left: in chair;with chair alarm set;with call bell/phone within reach;with nursing/sitter in room Nurse Communication: Mobility status PT Visit Diagnosis: Unsteadiness on feet (R26.81);History of falling (Z91.81);Difficulty in walking, not elsewhere classified (R26.2);Muscle weakness (generalized) (M62.81)     Time: [id number] PT Time Calculation (min) (ACUTE ONLY): 26 min  Charges:  $Gait Training: 8-22 mins $Therapeutic Exercise: 8-22 mins                     D. Scott Insiya Oshea PT, DPT 02/14/19, 4:04 PM

## 2019-02-14 NOTE — NC FL2 (Signed)
[place] LEVEL OF CARE SCREENING TOOL     IDENTIFICATION  Patient Name: Gregory Davenport Birthdate: [birth year] Sex: male Admission Date (Current Location): 02/11/2019  [place] and [place] Number:  Gregory Davenport [id number] Facility and Address:  [facility], [address], [place], [place] [address]      Provider Number: [id number]  Attending Physician Name and Address:  Gregory Davenport, *  Relative Name and Phone Number:  Gregory Davenport Daughter   [phone]    Current Level of Care: Hospital Recommended Level of Care: [employer] Prior Approval Number:    Date Approved/Denied:   PASRR Number: [id number]  Discharge Plan: SNF    Current Diagnoses: Patient Active Problem List   Diagnosis Date Noted  . Right sided weakness 02/11/2019    Orientation RESPIRATION BLADDER Height & Weight     Self, Time, Situation, Place  Normal Continent Weight: 201 lb 3.2 oz (91.3 kg) Height:  6' (182.9 cm)  BEHAVIORAL SYMPTOMS/MOOD NEUROLOGICAL BOWEL NUTRITION STATUS      Continent Diet(Renal Carb diet)  AMBULATORY STATUS COMMUNICATION OF NEEDS Skin   Limited Assist Verbally Normal                       Personal Care Assistance Level of Assistance  Bathing, Dressing, Feeding Bathing Assistance: Limited assistance Feeding assistance: Limited assistance Dressing Assistance: Limited assistance     Functional Limitations Info  Sight, Hearing, Speech Sight Info: Impaired Hearing Info: Adequate Speech Info: Adequate    SPECIAL CARE FACTORS FREQUENCY  PT (By licensed PT), OT (By licensed OT)     PT Frequency: Minimum 5x a week OT Frequency: Minimum 5x a week            Contractures Contractures Info: Not present    Additional Factors Info  Code Status, Allergies, Insulin Sliding Scale Code Status Info: Full Code Allergies Info: Other Combative with opioid medications   Insulin Sliding Scale Info: insulin  aspart (novoLOG) injection 0-9 Units 3x a day with meals.       Current Medications (02/14/2019):  This is the current hospital active medication list Current Facility-Administered Medications  Medication Dose Route Frequency Provider Last Rate Last Dose  .  stroke: mapping our early stages of recovery book   Does not apply Once Gregory Grayer, MD      . 0.9 %  sodium chloride infusion   Intravenous PRN Gregory Grayer, MD   Stopped at 02/12/19 1640  . acetaminophen (TYLENOL) tablet 650 mg  650 mg Oral Q6H PRN Gregory Snow, NP   650 mg at 02/11/19 2250  . amLODipine (NORVASC) tablet 10 mg  10 mg Oral Daily Gregory Grayer, MD   10 mg at 02/14/19 1132  . aspirin EC tablet 81 mg  81 mg Oral Daily Gregory Grayer, MD   81 mg at 02/14/19 1132  . atorvastatin (LIPITOR) tablet 40 mg  40 mg Oral q1800 Gregory Grayer, MD   40 mg at 02/13/19 1657  . brimonidine (ALPHAGAN) 0.2 % ophthalmic solution 1 drop  1 drop Right Eye BID Gregory Grayer, MD   1 drop at 02/14/19 1136  . carvedilol (COREG) tablet 12.5 mg  12.5 mg Oral BID Gregory Grayer, MD   12.5 mg at 02/14/19 1132  . cefTRIAXone (ROCEPHIN) 1 g in sodium chloride 0.9 % 100 mL IVPB  1 g Intravenous Q24H Gregory Grayer, MD 200 mL/hr at 02/13/19 1517 1 g at 02/13/19 1517  . Chlorhexidine Gluconate  Cloth 2 % PADS 6 each  6 each Topical Q0600 Gregory Iba, MD   6 each at 02/14/19 0549  . dorzolamide-timolol (COSOPT) 22.3-6.8 MG/ML ophthalmic solution 1 drop  1 drop Both Eyes BID Gregory Grayer, MD   1 drop at 02/14/19 1133  . gabapentin (NEURONTIN) capsule 100 mg  100 mg Oral QHS Gregory Grayer, MD   100 mg at 02/13/19 2200  . heparin injection 5,000 Units  5,000 Units Subcutaneous Q8H Gregory Grayer, MD   5,000 Units at 02/14/19 0549  . insulin aspart (novoLOG) injection 0-5 Units  0-5 Units Subcutaneous QHS Davenport, Richard, MD      . insulin aspart (novoLOG) injection 0-9 Units  0-9 Units Subcutaneous TID WC Gregory Grayer, MD   2 Units at 02/14/19 1141  . insulin glargine (LANTUS) injection 8 Units  8 Units Subcutaneous QHS Gregory Grayer, MD   8 Units at 02/13/19 2200  . pantoprazole (PROTONIX) EC tablet 40 mg  40 mg Oral Daily Gregory Grayer, MD   40 mg at 02/14/19 1133  . tamsulosin (FLOMAX) capsule 0.4 mg  0.4 mg Oral Daily Gregory Grayer, MD   0.4 mg at 02/14/19 1132     Discharge Medications: Please see discharge summary for a list of discharge medications.  Relevant Imaging Results:  Relevant Lab Results:   Additional Information SSN [id number] Currently gets dialysis MWF, working on trying to get him switched to a local dialysis center.  Gregory Ludwig, LCSW

## 2019-02-14 NOTE — Progress Notes (Signed)
[facility]  ROUNDING NOTE   Subjective:  Patient seen at bedside. Underwent dialysis yesterday and tolerated well.    Objective:  Vital signs in last 24 hours:  Temp:  [97.9 F (36.6 C)-98.9 F (37.2 C)] 98.7 F (37.1 C) (10/13 0809) Pulse Rate:  [62-81] 67 (10/13 0809) Resp:  [16-19] 18 (10/13 0809) BP: (128-177)/(59-82) 128/60 (10/13 0809) SpO2:  [95 %-100 %] 100 % (10/13 0809) Weight:  [91.3 kg-92.1 kg] 91.3 kg (10/13 0423)  Weight change:  Filed Weights   02/13/19 0915 02/13/19 1315 02/14/19 0423  Weight: 92 kg 92.1 kg 91.3 kg    Intake/Output: I/O last 3 completed shifts: In: -  Out: 1000 [Other:1000]   Intake/Output this shift:  No intake/output data recorded.  Physical Exam: General: No acute distress  Head: Normocephalic, atraumatic. Moist oral mucosal membranes  Eyes: Anicteric  Neck: Supple, trachea midline  Lungs:  Clear to auscultation, normal effort  Heart: S1S2 no rubs  Abdomen:  Soft, nontender, bowel sounds present  Extremities: Trace peripheral edema.  Neurologic: Awake, alert, following commands  Skin: No lesions  Access: Right upper extremity AV graft    Basic Metabolic Panel: Recent Labs  Lab 02/11/19 1127 02/12/19 0713 02/13/19 0624  NA 136 138  --   K 3.6 3.7  --   CL 94* 96*  --   CO2 29 28  --   GLUCOSE 107* 116*  --   BUN 25* 33*  --   CREATININE 7.57* 9.29*  --   CALCIUM 9.1 9.0  --   PHOS  --   --  4.8*    Liver Function Tests: Recent Labs  Lab 02/11/19 1127  AST 18  ALT 15  ALKPHOS 58  BILITOT 0.8  PROT 7.3  ALBUMIN 3.6   No results for input(s): LIPASE, AMYLASE in the last 168 hours. Recent Labs  Lab 02/12/19 0713  AMMONIA 25    CBC: Recent Labs  Lab 02/11/19 1127 02/12/19 0713  WBC 7.4 6.2  NEUTROABS 4.0  --   HGB 11.2* 11.8*  HCT 35.8* 37.1*  MCV 100.3* 99.7  PLT 168 181    Cardiac Enzymes: No results for input(s): CKTOTAL, CKMB, CKMBINDEX, TROPONINI in the last 168  hours.  BNP: Invalid input(s): POCBNP  CBG: Recent Labs  Lab 02/13/19 0748 02/13/19 1650 02/13/19 2133 02/14/19 0830 02/14/19 1131  GLUCAP 147* 160* 139* 106* 179*    Microbiology: Results for orders placed or performed during the hospital encounter of 02/11/19  Blood Culture (routine x 2)     Status: None (Preliminary result)   Collection Time: 02/11/19 11:27 AM   Specimen: BLOOD  Result Value Ref Range Status   Specimen Description BLOOD BLOOD LEFT HAND  Final   Special Requests   Final    BOTTLES DRAWN AEROBIC AND ANAEROBIC Blood Culture adequate volume   Culture   Final    NO GROWTH 3 DAYS Performed at [facility], [address], [place], [place] [address]    Report Status PENDING  Incomplete  Blood Culture (routine x 2)     Status: None (Preliminary result)   Collection Time: 02/11/19 11:28 AM   Specimen: BLOOD  Result Value Ref Range Status   Specimen Description BLOOD BLOOD LEFT ARM  Final   Special Requests   Final    BOTTLES DRAWN AEROBIC AND ANAEROBIC Blood Culture adequate volume   Culture   Final    NO GROWTH 3 DAYS Performed at [facility], [place], [place] [address]    Report Status PENDING  Incomplete  Urine culture     Status: Abnormal   Collection Time: 02/11/19  1:10 PM   Specimen: In/Out Cath Urine  Result Value Ref Range Status   Specimen Description   Final    IN/OUT CATH URINE Performed at [facility], [address], [place], [place] [address]    Special Requests   Final    NONE Performed at [facility], [address] [place], [place] [address]    Culture (A)  Final    >=100,000 COLONIES/mL ESCHERICHIA COLI Confirmed Extended Spectrum Beta-Lactamase Producer (ESBL).  In bloodstream infections from ESBL organisms, carbapenems are preferred over piperacillin/tazobactam. They are shown to have a lower risk of mortality.    Report Status 02/14/2019 FINAL  Final   Organism ID,  Bacteria ESCHERICHIA COLI (A)  Final      Susceptibility   Escherichia coli - MIC*    AMPICILLIN >=32 RESISTANT Resistant     CEFAZOLIN >=64 RESISTANT Resistant     CEFTRIAXONE >=64 RESISTANT Resistant     CIPROFLOXACIN >=4 RESISTANT Resistant     GENTAMICIN >=16 RESISTANT Resistant     IMIPENEM <=0.25 SENSITIVE Sensitive     NITROFURANTOIN <=16 SENSITIVE Sensitive     TRIMETH/SULFA >=320 RESISTANT Resistant     AMPICILLIN/SULBACTAM >=32 RESISTANT Resistant     PIP/TAZO 8 SENSITIVE Sensitive     Extended ESBL POSITIVE Resistant     * >=100,000 COLONIES/mL ESCHERICHIA COLI  SARS Coronavirus 2 by RT PCR (hospital order, performed in [place] hospital lab) Nasopharyngeal Nasopharyngeal Swab     Status: None   Collection Time: 02/11/19  1:10 PM   Specimen: Nasopharyngeal Swab  Result Value Ref Range Status   SARS Coronavirus 2 NEGATIVE NEGATIVE Final    Comment: (NOTE) If result is NEGATIVE SARS-CoV-2 target nucleic acids are NOT DETECTED. The SARS-CoV-2 RNA is generally detectable in upper and lower  respiratory specimens during the acute phase of infection. The lowest  concentration of SARS-CoV-2 viral copies this assay can detect is 250  copies / mL. A negative result does not preclude SARS-CoV-2 infection  and should not be used as the sole basis for treatment or other  patient management decisions.  A negative result may occur with  improper specimen collection / handling, submission of specimen other  than nasopharyngeal swab, presence of viral mutation(s) within the  areas targeted by this assay, and inadequate number of viral copies  (<250 copies / mL). A negative result must be combined with clinical  observations, patient history, and epidemiological information. If result is POSITIVE SARS-CoV-2 target nucleic acids are DETECTED. The SARS-CoV-2 RNA is generally detectable in upper and lower  respiratory specimens dur ing the acute phase of infection.  Positive  results  are indicative of active infection with SARS-CoV-2.  Clinical  correlation with patient history and other diagnostic information is  necessary to determine patient infection status.  Positive results do  not rule out bacterial infection or co-infection with other viruses. If result is PRESUMPTIVE POSTIVE SARS-CoV-2 nucleic acids MAY BE PRESENT.   A presumptive positive result was obtained on the submitted specimen  and confirmed on repeat testing.  While 2019 novel coronavirus  (SARS-CoV-2) nucleic acids may be present in the submitted sample  additional confirmatory testing may be necessary for epidemiological  and / or clinical management purposes  to differentiate between  SARS-CoV-2 and other Sarbecovirus currently  known to infect humans.  If clinically indicated additional testing with an alternate test  methodology [phone]) is advised. The SARS-CoV-2 RNA is generally  detectable in upper and lower respiratory sp ecimens during the acute  phase of infection. The expected result is Negative. Fact Sheet for Patients:  StrictlyIdeas.no Fact Sheet for Healthcare Providers: BankingDealers.co.za This test is not yet approved or cleared by the [place] FDA and has been authorized for detection and/or diagnosis of SARS-CoV-2 by FDA under an Emergency Use Authorization (EUA).  This EUA will remain in effect (meaning this test can be used) for the duration of the COVID-19 declaration under Section 564(b)(1) of the Act, 21 U.S.C. section 360bbb-3(b)(1), unless the authorization is terminated or revoked sooner. Performed at [facility], [place], [place] [address]   SARS CORONAVIRUS 2 (TAT 6-24 HRS) Nasopharyngeal Nasopharyngeal Swab     Status: None   Collection Time: 02/13/19  5:20 PM   Specimen: Nasopharyngeal Swab  Result Value Ref Range Status   SARS Coronavirus 2 NEGATIVE NEGATIVE Final    Comment:  (NOTE) SARS-CoV-2 target nucleic acids are NOT DETECTED. The SARS-CoV-2 RNA is generally detectable in upper and lower respiratory specimens during the acute phase of infection. Negative results do not preclude SARS-CoV-2 infection, do not rule out co-infections with other pathogens, and should not be used as the sole basis for treatment or other patient management decisions. Negative results must be combined with clinical observations, patient history, and epidemiological information. The expected result is Negative. Fact Sheet for Patients: SugarRoll.be Fact Sheet for Healthcare Providers: https://www.woods-mathews.com/ This test is not yet approved or cleared by the [place] FDA and  has been authorized for detection and/or diagnosis of SARS-CoV-2 by FDA under an Emergency Use Authorization (EUA). This EUA will remain  in effect (meaning this test can be used) for the duration of the COVID-19 declaration under Section 56 4(b)(1) of the Act, 21 U.S.C. section 360bbb-3(b)(1), unless the authorization is terminated or revoked sooner. Performed at [facility], [place] [address] [place],  [address]     Coagulation Studies: No results for input(s): LABPROT, INR in the last 72 hours.  Urinalysis: Recent Labs    02/11/19 1310  COLORURINE AMBER*  LABSPEC 1.013  PHURINE 6.0  GLUCOSEU NEGATIVE  HGBUR MODERATE*  BILIRUBINUR NEGATIVE  KETONESUR NEGATIVE  PROTEINUR 100*  NITRITE NEGATIVE  LEUKOCYTESUR LARGE*      Imaging: US Renal  Result Date: 02/13/2019 CLINICAL DATA:  Recurrent UTIs. EXAM: RENAL / URINARY TRACT ULTRASOUND COMPLETE COMPARISON:  None. FINDINGS: Right Kidney: Renal measurements: 9.4 x 3.6 x 4.5 cm = volume: 79.6 mL. Contains a 1.9 cm cyst. There is a small amount of fluid adjacent to the inferior pole of the right kidney. Left Kidney: Renal measurements: 9.0 x 4.7 x 3.3 cm = volume: 74.6 mL. There is a  small amount of fluid adjacent to the inferior pole the left kidney. Bladder: Bladder wall thickening.  Mobile debris seen in the bladder. IMPRESSION: 1. Bladder wall thickening and bladder debris may be due to the patient's reported recurrent UTIs. 2. There is a small amount of fluid adjacent to both kidneys, nonspecific. There is a 1.9 cm cyst in the right kidney. Electronically Signed   By: Dorise Bullion III M.D   On: 02/13/2019 16:57     Medications:   . sodium chloride Stopped (02/12/19 1640)  . cefTRIAXone (ROCEPHIN)  IV 1 g (02/13/19 1517)   .  stroke:  mapping our early stages of recovery book   Does not apply Once  . amLODipine  10 mg Oral Daily  . aspirin EC  81 mg Oral Daily  . atorvastatin  40 mg Oral q1800  . brimonidine  1 drop Right Eye BID  . carvedilol  12.5 mg Oral BID  . Chlorhexidine Gluconate Cloth  6 each Topical Q0600  . dorzolamide-timolol  1 drop Both Eyes BID  . gabapentin  100 mg Oral QHS  . heparin injection (subcutaneous)  5,000 Units Subcutaneous Q8H  . insulin aspart  0-5 Units Subcutaneous QHS  . insulin aspart  0-9 Units Subcutaneous TID WC  . insulin glargine  8 Units Subcutaneous QHS  . pantoprazole  40 mg Oral Daily  . tamsulosin  0.4 mg Oral Daily   sodium chloride, acetaminophen  Assessment/ Plan:  78 y.o. male  with Diabetes, HTN, ESRD, CAD was admitted on 02/11/2019 with generalized weakness, confusion and fever.   1.  ESRD on HD MWF followed by [facility] nephrology. Patient completed dialysis yesterday.  Tolerated well.  Next dialysis treatment tomorrow if still here.  2.  Anemia of chronic kidney disease.  Hold off on Epogen at this time.  3.  Secondary hyperparathyroidism.  Repeat serum phosphorus tomorrow if still here.  4.  Hypertension.  Maintain the patient on current doses of amlodipine and carvedilol.    LOS: 3 Kelicia Youtz 10/13/202012:07 PM

## 2019-02-14 NOTE — Progress Notes (Signed)
Patient ID: Gregory Davenport, male   DOB: 1940/10/09, 78 y.o.   MRN: [id number]  Called to see patient because he was less responsive.  I was able to awaken the patient and he was able to follow some commands.  I will discontinue the gabapentin that was just given.  Since he is moving all extremities I will hold off on any further imaging.  MRI of the brain was negative for stroke and he is moving his right arm better than he was on admission.  Urinary tract infection growing ESBL organism.  We will give IV Invanz.  Dr Loletha Grayer

## 2019-02-14 NOTE — Progress Notes (Signed)
[facility] [place] NAME: Gregory Davenport    MR#:  [id number]  DATE OF BIRTH:  February 07, 1941  SUBJECTIVE:  CHIEF COMPLAINT:   Chief Complaint  Patient presents with  . Altered Mental Status   Came with right-sided weakness.  Currently feels weakness is improving much. Noted no acute stroke on MRI. Much better and he was sitting in the chair today.  REVIEW OF SYSTEMS:  CONSTITUTIONAL: No fever, fatigue or weakness.  EYES: No blurred or double vision.  EARS, NOSE, AND THROAT: No tinnitus or ear pain.  RESPIRATORY: No cough, shortness of breath, wheezing or hemoptysis.  CARDIOVASCULAR: No chest pain, orthopnea, edema.  GASTROINTESTINAL: No nausea, vomiting, diarrhea or abdominal pain.  GENITOURINARY: No dysuria, hematuria.  ENDOCRINE: No polyuria, nocturia,  HEMATOLOGY: No anemia, easy bruising or bleeding SKIN: No rash or lesion. MUSCULOSKELETAL: No joint pain or arthritis.   NEUROLOGIC: No tingling, numbness, weakness.  PSYCHIATRY: No anxiety or depression.   ROS  DRUG ALLERGIES:   Allergies  Allergen Reactions  . Other Other (See Comments)    Combative with opioid medications    VITALS:  Blood pressure 134/72, pulse 65, temperature 98.6 F (37 C), temperature source Oral, resp. rate 18, height 6' (1.829 m), weight 91.3 kg, SpO2 100 %.  PHYSICAL EXAMINATION:  GENERAL:  78 y.o.-year-old patient lying in the bed with no acute distress.  EYES: Pupils equal, round, reactive to light and accommodation. No scleral icterus. Extraocular muscles intact.  HEENT: Head atraumatic, normocephalic. Oropharynx and nasopharynx clear.  NECK:  Supple, no jugular venous distention. No thyroid enlargement, no tenderness.  LUNGS: Normal breath sounds bilaterally, no wheezing, rales,rhonchi or crepitation. No use of accessory muscles of respiration.  CARDIOVASCULAR: S1, S2 normal. No murmurs, rubs, or gallops.  ABDOMEN: Soft, nontender, nondistended.  Bowel sounds present. No organomegaly or mass.  EXTREMITIES: No pedal edema, cyanosis, or clubbing.  NEUROLOGIC: Cranial nerves II through XII are intact. Muscle strength 4/5 in all extremities-somewhat decreased in the right side but able to move and follows,. Sensation intact. Gait not checked.  PSYCHIATRIC: The patient is alert and oriented x 3.  SKIN: No obvious rash, lesion, or ulcer.   Physical Exam LABORATORY PANEL:   CBC Recent Labs  Lab 02/12/19 0713  WBC 6.2  HGB 11.8*  HCT 37.1*  PLT 181   ------------------------------------------------------------------------------------------------------------------  Chemistries  Recent Labs  Lab 02/11/19 1127 02/12/19 0713  NA 136 138  K 3.6 3.7  CL 94* 96*  CO2 29 28  GLUCOSE 107* 116*  BUN 25* 33*  CREATININE 7.57* 9.29*  CALCIUM 9.1 9.0  AST 18  --   ALT 15  --   ALKPHOS 58  --   BILITOT 0.8  --    ------------------------------------------------------------------------------------------------------------------  Cardiac Enzymes No results for input(s): TROPONINI in the last 168 hours. ------------------------------------------------------------------------------------------------------------------  RADIOLOGY:  US Renal  Result Date: 02/13/2019 CLINICAL DATA:  Recurrent UTIs. EXAM: RENAL / URINARY TRACT ULTRASOUND COMPLETE COMPARISON:  None. FINDINGS: Right Kidney: Renal measurements: 9.4 x 3.6 x 4.5 cm = volume: 79.6 mL. Contains a 1.9 cm cyst. There is a small amount of fluid adjacent to the inferior pole of the right kidney. Left Kidney: Renal measurements: 9.0 x 4.7 x 3.3 cm = volume: 74.6 mL. There is a small amount of fluid adjacent to the inferior pole the left kidney. Bladder: Bladder wall thickening.  Mobile debris seen in the bladder. IMPRESSION: 1. Bladder wall thickening and bladder debris  may be due to the patient's reported recurrent UTIs. 2. There is a small amount of fluid adjacent to both kidneys,  nonspecific. There is a 1.9 cm cyst in the right kidney. Electronically Signed   By: Dorise Bullion III M.D   On: 02/13/2019 16:57    ASSESSMENT AND PLAN:   Active Problems:   Right sided weakness  1.  Right-sided weakness starting  after dialysis.  CT scan of the head negative.   MRI brain is negative.  Carotid Doppler study did not show significant acute blockages.  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   LDL is 95, on atorvastatin 40 mg. Continue aspirin.   Swallow screen.  Physical therapy occupational therapy evaluations.  Monitor on telemetry.  Appreciate neurology consult. PT suggested SNF placement.  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  2.  Fever with normal white count.  Urine analysis does look positive   Follow-up blood cultures are negative,  Chest x-ray negative.     COVID-19 negative. He was given IV ceftriaxone for possible UTI.  But his urine culture now reported ESBL.  He does not have any more fever for last 3 days and feeling much better and back to normal so I will stop ceftriaxone as it is not effective and ESBL anyways.  I will discuss with infectious disease physician to decide should we treat this ESBL in urine.  3.  End-stage renal disease on dialysis Monday Wednesday Friday.  Appreciated Consult nephrology.  4.  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  Decrease dose of Lantus down to 8 units and sliding scale.  Checked a hemoglobin A1c-6.9.  5.  Essential hypertension.  Continue usual medications. Allow permissive hypertension on first day and started on amlodipine.  Physical therapy suggested SNF placement.   All the records are reviewed and case discussed with Care Management/Social Workerr. Management plans discussed with the patient, family and they are in agreement.  CODE STATUS: Full  TOTAL TIME TAKING CARE OF THIS PATIENT: 35 minutes.  Likely discharge tomorrow once  we have approval.  Need also arrangement for hemodialysis.  POSSIBLE D/C IN 1-2 DAYS, DEPENDING ON CLINICAL CONDITION.   Vaughan Basta M.D on 02/14/2019   Between 7am to 6pm - Pager - [phone]  After 6pm go to [email or web address] - password [email or web address] [facility]  Office  [phone]  CC: Primary care physician; Delilah Shan, MD  Note: This dictation was prepared with Dragon dictation along with smaller phrase technology. Any transcriptional errors that result from this process are unintentional.

## 2019-02-14 NOTE — TOC Progression Note (Addendum)
Transition of Care [facility]) - Progression Note    Patient Details  Name: Gregory Davenport MRN: [id number] Date of Birth: 06-17-1940  [facility]) CM/SW Contact  Ross Ludwig, [place] Phone Number: 02/14/2019, 11:59 AM  Clinical Narrative:     CSW spoke to patient's daughter and presented bed offers, CSW waiting for decision from daughter.  Patient was at [facility] [phone] as a LTC resident for about 3 years, his LTC Medicaid number is through [facility], [place]:[id number] R.  Patient is currently a MWF dialysis patient going to [facility] in [place], however dialysis case Freight forwarder is working on trying to get patient's dialysis center switched to [facility].  CSW to continue to follow patient's progress throughout discharge planning.  1:00pm  CSW presented bed offers to daughter and she chose [facility].  CSW updated dialysis care manager, she is working on switching patient's dialysis center to a local facility.  [facility] said [facility] once dialysis center is set up.  CSW updated physician.   Expected Discharge Plan: [place] Barriers to Discharge: SNF Pending bed offer, Continued Medical Work up  Expected Discharge Plan and Services Expected Discharge Plan: [place] Choice: [place] arrangements for the past 2 months: [place], Single Family Home                                       Social Determinants of Health (SDOH) Interventions    Readmission Risk Interventions No flowsheet data found.

## 2019-02-15 DIAGNOSIS — N186 End stage renal disease: Secondary | ICD-10-CM

## 2019-02-15 DIAGNOSIS — N390 Urinary tract infection, site not specified: Secondary | ICD-10-CM

## 2019-02-15 DIAGNOSIS — H548 Legal blindness, as defined in USA: Secondary | ICD-10-CM

## 2019-02-15 DIAGNOSIS — Z79899 Other long term (current) drug therapy: Secondary | ICD-10-CM

## 2019-02-15 DIAGNOSIS — B9620 Unspecified Escherichia coli [E. coli] as the cause of diseases classified elsewhere: Secondary | ICD-10-CM

## 2019-02-15 DIAGNOSIS — Z885 Allergy status to narcotic agent status: Secondary | ICD-10-CM

## 2019-02-15 DIAGNOSIS — Z794 Long term (current) use of insulin: Secondary | ICD-10-CM

## 2019-02-15 DIAGNOSIS — Z87891 Personal history of nicotine dependence: Secondary | ICD-10-CM

## 2019-02-15 DIAGNOSIS — I120 Hypertensive chronic kidney disease with stage 5 chronic kidney disease or end stage renal disease: Secondary | ICD-10-CM

## 2019-02-15 DIAGNOSIS — Z992 Dependence on renal dialysis: Secondary | ICD-10-CM

## 2019-02-15 DIAGNOSIS — E785 Hyperlipidemia, unspecified: Secondary | ICD-10-CM

## 2019-02-15 DIAGNOSIS — E1122 Type 2 diabetes mellitus with diabetic chronic kidney disease: Secondary | ICD-10-CM

## 2019-02-15 DIAGNOSIS — Z1612 Extended spectrum beta lactamase (ESBL) resistance: Secondary | ICD-10-CM

## 2019-02-15 DIAGNOSIS — N400 Enlarged prostate without lower urinary tract symptoms: Secondary | ICD-10-CM

## 2019-02-15 LAB — GLUCOSE, CAPILLARY
Glucose-Capillary: 119 mg/dL — ABNORMAL HIGH (ref 70–99)
Glucose-Capillary: 107 mg/dL — ABNORMAL HIGH (ref 70–99)
Glucose-Capillary: 143 mg/dL — ABNORMAL HIGH (ref 70–99)
Glucose-Capillary: 143 mg/dL — ABNORMAL HIGH (ref 70–99)

## 2019-02-15 NOTE — Progress Notes (Signed)
Pre HD initiation note Pt arrived from his room to receive routine HD Tx. Pt reports No SOB or chest pain. Pt contact precautions are maintained. Access is [facility]   02/15/19 0915  Hand-Off documentation  Report given to (Full Name) Alric Quan RN   Report received from (Full Name) Keimon Basaldua   Vital Signs  Temp 98.8 F (37.1 C)  Temp Source Oral  Pulse Rate 63  Pulse Rate Source Monitor  Resp 13  BP (!) 164/83  BP Location Left Arm  BP Method Automatic  Patient Position (if appropriate) Lying  Oxygen Therapy  SpO2 99 %  O2 Device Room Air  Pain Assessment  Pain Scale 0-10  Pain Score 0  Dialysis Weight  Weight 95 kg  Type of Weight Pre-Dialysis  Time-Out for Hemodialysis  What Procedure? HD   Pt Identifiers(min of two) First/Last Name;MRN/Account#  Correct Site? Yes  Correct Side? Yes  Correct Procedure? Yes  Consents Verified? Yes  Safety Precautions Reviewed? Yes  Engineer, civil (consulting) Number [place] Number 2  UF/Alarm Test Passed  Conductivity: Meter 13.7  Conductivity: Machine  14  pH 7.2  Reverse Osmosis Main  Normal Saline Lot Number [id number]  Dialyzer Lot Number [id number]  Disposable Set Lot Number [id number]  Machine Temperature 98.6 F (37 C)  Musician and Audible Yes  Blood Lines Intact and Secured Yes  Pre Treatment Patient Checks  Vascular access used during treatment Graft  HD catheter dressing before treatment WDL  Patient is receiving dialysis in a chair  (In bed)  Hepatitis B Surface Antigen Results Negative  Date Hepatitis B Surface Antigen Drawn 06/07/18  Hepatitis B Surface Antibody  (>10)  Date Hepatitis B Surface Antibody Drawn 06/07/18  Hemodialysis Consent Verified Yes  Hemodialysis Standing Orders Initiated Yes  ECG (Telemetry) Monitor On Yes  Prime Ordered Normal Saline  Length of  DialysisTreatment -hour(s) 3.5 Hour(s)  Dialysis Treatment Comments  (Na140)  Dialyzer Elisio 17H NR  Dialysate 2K;2.5 Ca  Dialysis  Anticoagulant None  Dialysate Flow Ordered 600  Blood Flow Rate Ordered 400 mL/min  Ultrafiltration Goal 1.5 Liters  Dialysis Blood Pressure Support Ordered Albumin  Education / Care Plan  Dialysis Education Provided Yes  Documented Education in Care Plan Yes  Fistula / Graft  No Placement Date or Time found.   Placed prior to admission: Yes  Site Condition No complications  Fistula / Graft Assessment Present;Thrill;Bruit  Status Accessed  Needle Size 15  Drainage Description None

## 2019-02-15 NOTE — Progress Notes (Signed)
Post void residual result is 54.

## 2019-02-15 NOTE — Progress Notes (Signed)
[facility] NAME: Gregory Davenport    MR#:  [id number]  DATE OF BIRTH:  1940/05/30  SUBJECTIVE:  CHIEF COMPLAINT:   Chief Complaint  Patient presents with  . Altered Mental Status   Came with right-sided weakness.  Currently feels weakness is improving much. Noted no acute stroke on MRI. He had a small episode of confusion last night and started on ertapenem by on-call doctor. I have seen him during dialysis today.  He appeared back to his baseline as last few days I have seen.  REVIEW OF SYSTEMS:  CONSTITUTIONAL: No fever, fatigue or weakness.  EYES: No blurred or double vision.  EARS, NOSE, AND THROAT: No tinnitus or ear pain.  RESPIRATORY: No cough, shortness of breath, wheezing or hemoptysis.  CARDIOVASCULAR: No chest pain, orthopnea, edema.  GASTROINTESTINAL: No nausea, vomiting, diarrhea or abdominal pain.  GENITOURINARY: No dysuria, hematuria.  ENDOCRINE: No polyuria, nocturia,  HEMATOLOGY: No anemia, easy bruising or bleeding SKIN: No rash or lesion. MUSCULOSKELETAL: No joint pain or arthritis.   NEUROLOGIC: No tingling, numbness, weakness.  PSYCHIATRY: No anxiety or depression.   ROS  DRUG ALLERGIES:   Allergies  Allergen Reactions  . Other Other (See Comments)    Combative with opioid medications    VITALS:  Blood pressure (!) 172/93, pulse 64, temperature 98.4 F (36.9 C), temperature source Oral, resp. rate 20, height 6' (1.829 m), weight 95 kg, SpO2 96 %.  PHYSICAL EXAMINATION:  GENERAL:  78 y.o.-year-old patient lying in the bed with no acute distress.  EYES: Pupils equal, round, reactive to light and accommodation. No scleral icterus. Extraocular muscles intact.  HEENT: Head atraumatic, normocephalic. Oropharynx and nasopharynx clear.  NECK:  Supple, no jugular venous distention. No thyroid enlargement, no tenderness.  LUNGS: Normal breath sounds bilaterally, no wheezing, rales,rhonchi or crepitation. No  use of accessory muscles of respiration.  CARDIOVASCULAR: S1, S2 normal. No murmurs, rubs, or gallops.  ABDOMEN: Soft, nontender, nondistended. Bowel sounds present. No organomegaly or mass.  EXTREMITIES: No pedal edema, cyanosis, or clubbing.  NEUROLOGIC: Cranial nerves II through XII are intact. Muscle strength 4/5 in all extremities-somewhat decreased in the right side but able to move and follows,. Sensation intact. Gait not checked.  PSYCHIATRIC: The patient is alert and oriented x 3.  SKIN: No obvious rash, lesion, or ulcer.   Physical Exam LABORATORY PANEL:   CBC Recent Labs  Lab 02/12/19 0713  WBC 6.2  HGB 11.8*  HCT 37.1*  PLT 181   ------------------------------------------------------------------------------------------------------------------  Chemistries  Recent Labs  Lab 02/11/19 1127 02/12/19 0713  NA 136 138  K 3.6 3.7  CL 94* 96*  CO2 29 28  GLUCOSE 107* 116*  BUN 25* 33*  CREATININE 7.57* 9.29*  CALCIUM 9.1 9.0  AST 18  --   ALT 15  --   ALKPHOS 58  --   BILITOT 0.8  --    ------------------------------------------------------------------------------------------------------------------  Cardiac Enzymes No results for input(s): TROPONINI in the last 168 hours. ------------------------------------------------------------------------------------------------------------------  RADIOLOGY:  US Renal  Result Date: 02/13/2019 CLINICAL DATA:  Recurrent UTIs. EXAM: RENAL / URINARY TRACT ULTRASOUND COMPLETE COMPARISON:  None. FINDINGS: Right Kidney: Renal measurements: 9.4 x 3.6 x 4.5 cm = volume: 79.6 mL. Contains a 1.9 cm cyst. There is a small amount of fluid adjacent to the inferior pole of the right kidney. Left Kidney: Renal measurements: 9.0 x 4.7 x 3.3 cm = volume: 74.6 mL. There is a small amount of  fluid adjacent to the inferior pole the left kidney. Bladder: Bladder wall thickening.  Mobile debris seen in the bladder. IMPRESSION: 1. Bladder wall  thickening and bladder debris may be due to the patient's reported recurrent UTIs. 2. There is a small amount of fluid adjacent to both kidneys, nonspecific. There is a 1.9 cm cyst in the right kidney. Electronically Signed   By: Dorise Bullion III M.D   On: 02/13/2019 16:57    ASSESSMENT AND PLAN:   Active Problems:   Right sided weakness  1.  Right-sided weakness starting  after dialysis.  CT scan of the head negative.   MRI brain is negative.  Carotid Doppler study did not show significant acute blockages.  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   LDL is 95, on atorvastatin 40 mg. Continue aspirin.   Swallow screen.  Physical therapy occupational therapy evaluations.  Monitor on telemetry.  Appreciate neurology consult. PT suggested SNF placement.  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 As he is a dialysis patient we also need to have his dialysis arranged locally while discharging to rehab.   2.  Fever with normal white count.  Urine analysis does look positive   Follow-up blood cultures are negative,  Chest x-ray negative.     COVID-19 negative. He was given IV ceftriaxone for possible UTI.  But his urine culture now reported ESBL.  He does not have any more fever for last 3 days and feeling much better and back to normal so I will stop ceftriaxone as it is not effective and ESBL anyways.  I will discuss with infectious disease physician to decide should we treat this ESBL in urine.  3.  End-stage renal disease on dialysis Monday Wednesday Friday.  Appreciated Consult nephrology.  4.  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  Decrease dose of Lantus down to 8 units and sliding scale.  Checked a hemoglobin A1c-6.9.  5.  Essential hypertension.  Continue usual medications. Allow permissive hypertension on first day and started on amlodipine.  Physical therapy suggested SNF placement.   All the records are  reviewed and case discussed with Care Management/Social Workerr. Management plans discussed with the patient, family and they are in agreement.  CODE STATUS: Full  TOTAL TIME TAKING CARE OF THIS PATIENT: 35 minutes.  Likely discharge tomorrow once we have approval.  Need also arrangement for hemodialysis.  Need to decide need for IV antibiotics due to finding ESBL in his urine culture.  POSSIBLE D/C IN 1-2 DAYS, DEPENDING ON CLINICAL CONDITION.   Vaughan Basta M.D on 02/15/2019   Between 7am to 6pm - Pager - [phone]  After 6pm go to [email or web address] - password [email or web address] [facility]  Office  [phone]  CC: Primary care physician; Delilah Shan, MD  Note: This dictation was prepared with Dragon dictation along with smaller phrase technology. Any transcriptional errors that result from this process are unintentional.

## 2019-02-15 NOTE — Progress Notes (Signed)
[facility]  ROUNDING NOTE   Subjective:  Patient seen and evaluated during dialysis treatment. Tolerating well. Awaiting placement at local dialysis center.    Objective:  Vital signs in last 24 hours:  Temp:  [97.6 F (36.4 C)-98.8 F (37.1 C)] 98.8 F (37.1 C) (10/14 0915) Pulse Rate:  [58-67] 62 (10/14 1130) Resp:  [6-20] 11 (10/14 1130) BP: (126-175)/(56-90) 166/75 (10/14 1130) SpO2:  [95 %-100 %] 97 % (10/14 1130) Weight:  [95 kg] 95 kg (10/14 0915)  Weight change:  Filed Weights   02/13/19 1315 02/14/19 0423 02/15/19 0915  Weight: 92.1 kg 91.3 kg 95 kg    Intake/Output: I/O last 3 completed shifts: In: 250 [P.O.:200; IV Piggyback:50] Out: 125 [Urine:125]   Intake/Output this shift:  No intake/output data recorded.  Physical Exam: General: No acute distress  Head: Normocephalic, atraumatic. Moist oral mucosal membranes  Eyes: Anicteric  Neck: Supple, trachea midline  Lungs:  Clear to auscultation, normal effort  Heart: S1S2 no rubs  Abdomen:  Soft, nontender, bowel sounds present  Extremities: Trace peripheral edema.  Neurologic: Awake, alert, following commands  Skin: No lesions  Access: Right upper extremity AV graft    Basic Metabolic Panel: Recent Labs  Lab 02/11/19 1127 02/12/19 0713 02/13/19 0624  NA 136 138  --   K 3.6 3.7  --   CL 94* 96*  --   CO2 29 28  --   GLUCOSE 107* 116*  --   BUN 25* 33*  --   CREATININE 7.57* 9.29*  --   CALCIUM 9.1 9.0  --   PHOS  --   --  4.8*    Liver Function Tests: Recent Labs  Lab 02/11/19 1127  AST 18  ALT 15  ALKPHOS 58  BILITOT 0.8  PROT 7.3  ALBUMIN 3.6   No results for input(s): LIPASE, AMYLASE in the last 168 hours. Recent Labs  Lab 02/12/19 0713  AMMONIA 25    CBC: Recent Labs  Lab 02/11/19 1127 02/12/19 0713  WBC 7.4 6.2  NEUTROABS 4.0  --   HGB 11.2* 11.8*  HCT 35.8* 37.1*  MCV 100.3* 99.7  PLT 168 181    Cardiac Enzymes: No results for input(s):  CKTOTAL, CKMB, CKMBINDEX, TROPONINI in the last 168 hours.  BNP: Invalid input(s): POCBNP  CBG: Recent Labs  Lab 02/14/19 0830 02/14/19 1131 02/14/19 1650 02/14/19 2115 02/15/19 0817  GLUCAP 106* 179* 122* 141* 119*    Microbiology: Results for orders placed or performed during the hospital encounter of 02/11/19  Blood Culture (routine x 2)     Status: None (Preliminary result)   Collection Time: 02/11/19 11:27 AM   Specimen: BLOOD  Result Value Ref Range Status   Specimen Description BLOOD BLOOD LEFT HAND  Final   Special Requests   Final    BOTTLES DRAWN AEROBIC AND ANAEROBIC Blood Culture adequate volume   Culture   Final    NO GROWTH 4 DAYS Performed at [facility], [address] [place], [place] [address]    Report Status PENDING  Incomplete  Blood Culture (routine x 2)     Status: None (Preliminary result)   Collection Time: 02/11/19 11:28 AM   Specimen: BLOOD  Result Value Ref Range Status   Specimen Description BLOOD BLOOD LEFT ARM  Final   Special Requests   Final    BOTTLES DRAWN AEROBIC AND ANAEROBIC Blood Culture adequate volume   Culture   Final    NO GROWTH  4 DAYS Performed at [facility], [address] [place], [place] [address]    Report Status PENDING  Incomplete  Urine culture     Status: Abnormal   Collection Time: 02/11/19  1:10 PM   Specimen: In/Out Cath Urine  Result Value Ref Range Status   Specimen Description   Final    IN/OUT CATH URINE Performed at [facility], [address] [place], [place] [address]    Special Requests   Final    NONE Performed at [facility], [address] [place], [place] [address]    Culture (A)  Final    >=100,000 COLONIES/mL ESCHERICHIA COLI Confirmed Extended Spectrum Beta-Lactamase Producer (ESBL).  In bloodstream infections from ESBL organisms, carbapenems are preferred over piperacillin/tazobactam. They are shown to have a lower risk of mortality.     Report Status 02/14/2019 FINAL  Final   Organism ID, Bacteria ESCHERICHIA COLI (A)  Final      Susceptibility   Escherichia coli - MIC*    AMPICILLIN >=32 RESISTANT Resistant     CEFAZOLIN >=64 RESISTANT Resistant     CEFTRIAXONE >=64 RESISTANT Resistant     CIPROFLOXACIN >=4 RESISTANT Resistant     GENTAMICIN >=16 RESISTANT Resistant     IMIPENEM <=0.25 SENSITIVE Sensitive     NITROFURANTOIN <=16 SENSITIVE Sensitive     TRIMETH/SULFA >=320 RESISTANT Resistant     AMPICILLIN/SULBACTAM >=32 RESISTANT Resistant     PIP/TAZO 8 SENSITIVE Sensitive     Extended ESBL POSITIVE Resistant     * >=100,000 COLONIES/mL ESCHERICHIA COLI  SARS Coronavirus 2 by RT PCR (hospital order, performed in [facility] hospital lab) Nasopharyngeal Nasopharyngeal Swab     Status: None   Collection Time: 02/11/19  1:10 PM   Specimen: Nasopharyngeal Swab  Result Value Ref Range Status   SARS Coronavirus 2 NEGATIVE NEGATIVE Final    Comment: (NOTE) If result is NEGATIVE SARS-CoV-2 target nucleic acids are NOT DETECTED. The SARS-CoV-2 RNA is generally detectable in upper and lower  respiratory specimens during the acute phase of infection. The lowest  concentration of SARS-CoV-2 viral copies this assay can detect is 250  copies / mL. A negative result does not preclude SARS-CoV-2 infection  and should not be used as the sole basis for treatment or other  patient management decisions.  A negative result may occur with  improper specimen collection / handling, submission of specimen other  than nasopharyngeal swab, presence of viral mutation(s) within the  areas targeted by this assay, and inadequate number of viral copies  (<250 copies / mL). A negative result must be combined with clinical  observations, patient history, and epidemiological information. If result is POSITIVE SARS-CoV-2 target nucleic acids are DETECTED. The SARS-CoV-2 RNA is generally detectable in upper and lower  respiratory specimens  dur ing the acute phase of infection.  Positive  results are indicative of active infection with SARS-CoV-2.  Clinical  correlation with patient history and other diagnostic information is  necessary to determine patient infection status.  Positive results do  not rule out bacterial infection or co-infection with other viruses. If result is PRESUMPTIVE POSTIVE SARS-CoV-2 nucleic acids MAY BE PRESENT.   A presumptive positive result was obtained on the submitted specimen  and confirmed on repeat testing.  While 2019 novel coronavirus  (SARS-CoV-2) nucleic acids may be present in the submitted sample  additional confirmatory testing may be necessary for epidemiological  and / or clinical management purposes  to differentiate between  SARS-CoV-2 and other Sarbecovirus currently known to infect humans.  If clinically indicated additional testing with an alternate test  methodology [phone]) is advised. The SARS-CoV-2 RNA is generally  detectable in upper and lower respiratory sp ecimens during the acute  phase of infection. The expected result is Negative. Fact Sheet for Patients:  StrictlyIdeas.no Fact Sheet for Healthcare Providers: BankingDealers.co.za This test is not yet approved or cleared by the [place] FDA and has been authorized for detection and/or diagnosis of SARS-CoV-2 by FDA under an Emergency Use Authorization (EUA).  This EUA will remain in effect (meaning this test can be used) for the duration of the COVID-19 declaration under Section 564(b)(1) of the Act, 21 U.S.C. section 360bbb-3(b)(1), unless the authorization is terminated or revoked sooner. Performed at [facility], [place], [place] [address]   SARS CORONAVIRUS 2 (TAT 6-24 HRS) Nasopharyngeal Nasopharyngeal Swab     Status: None   Collection Time: 02/13/19  5:20 PM   Specimen: Nasopharyngeal Swab  Result Value Ref Range Status    SARS Coronavirus 2 NEGATIVE NEGATIVE Final    Comment: (NOTE) SARS-CoV-2 target nucleic acids are NOT DETECTED. The SARS-CoV-2 RNA is generally detectable in upper and lower respiratory specimens during the acute phase of infection. Negative results do not preclude SARS-CoV-2 infection, do not rule out co-infections with other pathogens, and should not be used as the sole basis for treatment or other patient management decisions. Negative results must be combined with clinical observations, patient history, and epidemiological information. The expected result is Negative. Fact Sheet for Patients: SugarRoll.be Fact Sheet for Healthcare Providers: https://www.woods-mathews.com/ This test is not yet approved or cleared by the [place] FDA and  has been authorized for detection and/or diagnosis of SARS-CoV-2 by FDA under an Emergency Use Authorization (EUA). This EUA will remain  in effect (meaning this test can be used) for the duration of the COVID-19 declaration under Section 56 4(b)(1) of the Act, 21 U.S.C. section 360bbb-3(b)(1), unless the authorization is terminated or revoked sooner. Performed at [facility], [place] [address], [place], [place] [address]     Coagulation Studies: No results for input(s): LABPROT, INR in the last 72 hours.  Urinalysis: No results for input(s): COLORURINE, LABSPEC, PHURINE, GLUCOSEU, HGBUR, BILIRUBINUR, KETONESUR, PROTEINUR, UROBILINOGEN, NITRITE, LEUKOCYTESUR in the last 72 hours.  Invalid input(s): APPERANCEUR    Imaging: US Renal  Result Date: 02/13/2019 CLINICAL DATA:  Recurrent UTIs. EXAM: RENAL / URINARY TRACT ULTRASOUND COMPLETE COMPARISON:  None. FINDINGS: Right Kidney: Renal measurements: 9.4 x 3.6 x 4.5 cm = volume: 79.6 mL. Contains a 1.9 cm cyst. There is a small amount of fluid adjacent to the inferior pole of the right kidney. Left Kidney: Renal measurements: 9.0 x 4.7 x 3.3 cm =  volume: 74.6 mL. There is a small amount of fluid adjacent to the inferior pole the left kidney. Bladder: Bladder wall thickening.  Mobile debris seen in the bladder. IMPRESSION: 1. Bladder wall thickening and bladder debris may be due to the patient's reported recurrent UTIs. 2. There is a small amount of fluid adjacent to both kidneys, nonspecific. There is a 1.9 cm cyst in the right kidney. Electronically Signed   By: Dorise Bullion III M.D   On: 02/13/2019 16:57     Medications:   . sodium chloride Stopped (02/12/19 1640)  . ertapenem 500 mg (02/14/19 2341)   .  stroke: mapping our early stages of recovery book   Does not apply Once  .  amLODipine  10 mg Oral Daily  . aspirin EC  81 mg Oral Daily  . atorvastatin  40 mg Oral q1800  . brimonidine  1 drop Right Eye BID  . carvedilol  12.5 mg Oral BID  . Chlorhexidine Gluconate Cloth  6 each Topical Q0600  . dorzolamide-timolol  1 drop Both Eyes BID  . heparin injection (subcutaneous)  5,000 Units Subcutaneous Q8H  . insulin aspart  0-5 Units Subcutaneous QHS  . insulin aspart  0-9 Units Subcutaneous TID WC  . insulin glargine  8 Units Subcutaneous QHS  . pantoprazole  40 mg Oral Daily  . polyethylene glycol  17 g Oral Daily  . tamsulosin  0.4 mg Oral Daily   sodium chloride, acetaminophen  Assessment/ Plan:  78 y.o. male  with Diabetes, HTN, ESRD, CAD was admitted on 02/11/2019 with generalized weakness, confusion and fever.   1.  ESRD on HD MWF followed by [facility] nephrology. Patient seen and evaluated during hemodialysis.  Tolerating well.  We plan to complete dialysis treatment today.  We are currently searching for outpatient hemodialysis center in [facility] as patient is relocating here.  2.  Anemia of chronic kidney disease.  Consider Epogen as an outpatient.  3.  Secondary hyperparathyroidism.  Most recent serum phosphorus was 4.8 and at target.  4.  Hypertension.  Continue amlodipine and carvedilol..    LOS:  4 Shakeyla Giebler 10/14/202011:39 AM

## 2019-02-15 NOTE — Progress Notes (Signed)
PT Cancellation Note  Patient Details Name: Gregory Davenport MRN: [id number] DOB: 1940-09-14   Cancelled Treatment:    Reason Eval/Treat Not Completed: Patient at procedure or test/unavailable   Pt remains at dialysis and unavailable for session.    Chesley Noon 02/15/2019, 12:55 PM

## 2019-02-15 NOTE — Consult Note (Signed)
NAME: Gregory Davenport  DOB: 1940-11-04  MRN: [id number]  Date/Time: 02/15/2019 3:35 PM  REQUESTING PROVIDER: Dr. Anselm Jungling Subjective:  REASON FOR CONSULT: ESBL E. coli UTI ?History from daughter, chart reviewed Gregory Davenport is a 78 y.o. male with a history of end-stage renal disease, diabetes mellitus, legal blindness presented from home on 02/11/2019 with weakness,  some drowsiness and leaning towards the right side since dialysis the day before.  As per daughter patient also had a temperature of 100.9.   In the ED he had a temperature of 101.  Blood cultures were sent.  CT of the head was done without contrast and that showed advanced atrophy and microvascular ischemia.  MRI was done which showed atrophy and small vessel disease.  Her urine was sent and that showed more than 50 WBC.  Patient makes urine intermittently as he is on dialysis patient was started on vancomycin and ceftriaxone.  And he improved in 24 to 48 hours.  The urine culture then came back as ESBL E. coli.  The patient had improved in spite of not being treated for the ESBL E. coli and hence no other antibiotic was started as per primary team.   last night he was less responsive and was assessed by the on-call hospitalist who started him on IV ertapenem for this ESBL organism in the urine.  I am asked to see the patient for the same  Past Medical History:  Diagnosis Date  . CAD (coronary artery disease)   . Diabetes mellitus without complication ([place])   . Dialysis patient ([place])   . End stage renal disease ([place])   . Heart attack ([place])   . Hypertension     Past Surgical History:  Procedure Laterality Date  . EYE SURGERY    . fistula right arm      Social History   Socioeconomic History  . Marital status: Widowed    Spouse name: Not on file  . Number of children: Not on file  . Years of education: Not on file  . Highest education level: Not on file  Occupational History  . Not on file  Social Needs  . Financial resource  strain: Not on file  . Food insecurity    Worry: Not on file    Inability: Not on file  . Transportation needs    Medical: Not on file    Non-medical: Not on file  Tobacco Use  . Smoking status: Former Research scientist (life sciences)  . Smokeless tobacco: Never Used  Substance and Sexual Activity  . Alcohol use: Never    Frequency: Never  . Drug use: Never  . Sexual activity: Not on file  Lifestyle  . Physical activity    Days per week: Not on file    Minutes per session: Not on file  . Stress: Not on file  Relationships  . Social Herbalist on phone: Not on file    Gets together: Not on file    Attends religious service: Not on file    Active member of club or organization: Not on file    Attends meetings of clubs or organizations: Not on file    Relationship status: Not on file  . Intimate partner violence    Fear of current or ex partner: Not on file    Emotionally abused: Not on file    Physically abused: Not on file    Forced sexual activity: Not on file  Other Topics Concern  . Not on file  Social History Narrative  . Not on file    Family History  Problem Relation Age of Onset  . Dementia Mother   . Hypertension Mother   . Diabetes Sister   . Kidney failure Sister    Allergies  Allergen Reactions  . Other Other (See Comments)    Combative with opioid medications   ? Current Facility-Administered Medications  Medication Dose Route Frequency Provider Last Rate Last Dose  .  stroke: mapping our early stages of recovery book   Does not apply Once Loletha Grayer, MD      . 0.9 %  sodium chloride infusion   Intravenous PRN Loletha Grayer, MD   Stopped at 02/12/19 1640  . acetaminophen (TYLENOL) tablet 650 mg  650 mg Oral Q6H PRN Lang Snow, NP   650 mg at 02/14/19 2336  . amLODipine (NORVASC) tablet 10 mg  10 mg Oral Daily Loletha Grayer, MD   10 mg at 02/15/19 1402  . aspirin EC tablet 81 mg  81 mg Oral Daily Loletha Grayer, MD   81 mg at 02/15/19 1400   . atorvastatin (LIPITOR) tablet 40 mg  40 mg Oral q1800 Loletha Grayer, MD   40 mg at 02/14/19 1726  . brimonidine (ALPHAGAN) 0.2 % ophthalmic solution 1 drop  1 drop Right Eye BID Loletha Grayer, MD   1 drop at 02/14/19 2136  . carvedilol (COREG) tablet 12.5 mg  12.5 mg Oral BID Loletha Grayer, MD   12.5 mg at 02/14/19 1726  . Chlorhexidine Gluconate Cloth 2 % PADS 6 each  6 each Topical Q0600 Murlean Iba, MD   6 each at 02/15/19 0544  . dorzolamide-timolol (COSOPT) 22.3-6.8 MG/ML ophthalmic solution 1 drop  1 drop Both Eyes BID Loletha Grayer, MD   1 drop at 02/14/19 2136  . ertapenem (INVANZ) 500 mg in sodium chloride 0.9 % 50 mL IVPB  500 mg Intravenous Q24H Loletha Grayer, MD 100 mL/hr at 02/14/19 2341 500 mg at 02/14/19 2341  . heparin injection 5,000 Units  5,000 Units Subcutaneous Q8H Loletha Grayer, MD   5,000 Units at 02/15/19 1405  . insulin aspart (novoLOG) injection 0-5 Units  0-5 Units Subcutaneous QHS Wieting, Richard, MD      . insulin aspart (novoLOG) injection 0-9 Units  0-9 Units Subcutaneous TID WC Loletha Grayer, MD   1 Units at 02/14/19 1726  . insulin glargine (LANTUS) injection 8 Units  8 Units Subcutaneous QHS Loletha Grayer, MD   8 Units at 02/14/19 2135  . pantoprazole (PROTONIX) EC tablet 40 mg  40 mg Oral Daily Loletha Grayer, MD   40 mg at 02/15/19 1401  . polyethylene glycol (MIRALAX / GLYCOLAX) packet 17 g  17 g Oral Daily Vaughan Basta, MD   17 g at 02/15/19 1359  . tamsulosin (FLOMAX) capsule 0.4 mg  0.4 mg Oral Daily Loletha Grayer, MD   0.4 mg at 02/15/19 1401     Abtx:  Anti-infectives (From admission, onward)   Start     Dose/Rate Route Frequency Ordered Stop   02/14/19 2230  ertapenem (INVANZ) 500 mg in sodium chloride 0.9 % 50 mL IVPB     500 mg 100 mL/hr over 30 Minutes Intravenous Every 24 hours 02/14/19 2143     02/12/19 1500  cefTRIAXone (ROCEPHIN) 1 g in sodium chloride 0.9 % 100 mL IVPB  Status:  Discontinued     1  g 200 mL/hr over 30 Minutes Intravenous Every 24 hours 02/11/19 1531 02/14/19 1234  02/11/19 2230  vancomycin (VANCOCIN) IVPB 1000 mg/200 mL premix     1,000 mg 200 mL/hr over 60 Minutes Intravenous  Once 02/11/19 2219 02/11/19 2356   02/11/19 1530  vancomycin (VANCOCIN) IVPB 1000 mg/200 mL premix  Status:  Discontinued     1,000 mg 200 mL/hr over 60 Minutes Intravenous  Once 02/11/19 1516 02/11/19 2219   02/11/19 1500  cefTRIAXone (ROCEPHIN) 1 g in sodium chloride 0.9 % 100 mL IVPB     1 g 200 mL/hr over 30 Minutes Intravenous  Once 02/11/19 1452 02/11/19 1541      REVIEW OF SYSTEMS:  Const:  fever, negative chills, negative weight loss Eyes: Legally blind ENT: negative coryza, negative sore throat Resp: negative cough, hemoptysis, dyspnea Cards: negative for chest pain, palpitations, lower extremity edema GU: negative for frequency, dysuria and hematuria GI: Negative for abdominal pain, diarrhea, bleeding, constipation Skin: negative for rash and pruritus Heme: negative for easy bruising and gum/nose bleeding MS: Complains of pain left great toe Neurolo:negative for headaches, dizziness, vertigo, memory problems  Psych: negative for feelings of anxiety, depression  Endocrine: negative for thyroid, diabetes Allergy/Immunology- negative for any medication or food allergies ? Objective:  VITALS:  BP (!) 172/93   Pulse 64   Temp 98.4 F (36.9 C) (Oral)   Resp 20   Ht 6' (1.829 m)   Wt 95 kg   SpO2 96%   BMI 28.40 kg/m  PHYSICAL EXAM:  General: Alert, cooperative, no distress, young for his age.  Head: Normocephalic, without obvious abnormality, atraumatic. Eyes: Legally blind ENT Nares normal. No drainage or sinus tenderness. Lips, mucosa, and tongue normal. No Thrush Neck: Supple, symmetrical, no adenopathy, thyroid: non tender no carotid bruit and no JVD. Back: No CVA tenderness. Lungs: Clear to auscultation bilaterally. No Wheezing or Rhonchi. No rales. Heart:  S1-S2 Abdomen: Soft, non-tender,not distended. Bowel sounds normal. No masses Extremities: Tenderness over the left MTP joint Skin: No rashes or lesions. Or bruising Lymph: Cervical, supraclavicular normal. Neurologic: Grossly non-focal Pertinent Labs Lab Results CBC    Component Value Date/Time   WBC 6.2 02/12/2019 0713   RBC 3.72 (L) 02/12/2019 0713   HGB 11.8 (L) 02/12/2019 0713   HCT 37.1 (L) 02/12/2019 0713   PLT 181 02/12/2019 0713   MCV 99.7 02/12/2019 0713   MCH 31.7 02/12/2019 0713   MCHC 31.8 02/12/2019 0713   RDW 14.3 02/12/2019 0713   LYMPHSABS 2.1 02/11/2019 1127   MONOABS 1.1 (H) 02/11/2019 1127   EOSABS 0.1 02/11/2019 1127   BASOSABS 0.0 02/11/2019 1127    CMP Latest Ref Rng & Units 02/12/2019 02/11/2019  Glucose 70 - 99 mg/dL 116(H) 107(H)  BUN 8 - 23 mg/dL 33(H) 25(H)  Creatinine 0.61 - 1.24 mg/dL 9.29(H) 7.57(H)  Sodium 135 - 145 mmol/L 138 136  Potassium 3.5 - 5.1 mmol/L 3.7 3.6  Chloride 98 - 111 mmol/L 96(L) 94(L)  CO2 22 - 32 mmol/L 28 29  Calcium 8.9 - 10.3 mg/dL 9.0 9.1  Total Protein 6.5 - 8.1 g/dL - 7.3  Total Bilirubin 0.3 - 1.2 mg/dL - 0.8  Alkaline Phos 38 - 126 U/L - 58  AST 15 - 41 U/L - 18  ALT 0 - 44 U/L - 15      Microbiology: Recent Results (from the past 240 hour(s))  Blood Culture (routine x 2)     Status: None (Preliminary result)   Collection Time: 02/11/19 11:27 AM   Specimen: BLOOD  Result Value Ref Range  Status   Specimen Description BLOOD BLOOD LEFT HAND  Final   Special Requests   Final    BOTTLES DRAWN AEROBIC AND ANAEROBIC Blood Culture adequate volume   Culture   Final    NO GROWTH 4 DAYS Performed at [facility], [address] [place], [place] [address]    Report Status PENDING  Incomplete  Blood Culture (routine x 2)     Status: None (Preliminary result)   Collection Time: 02/11/19 11:28 AM   Specimen: BLOOD  Result Value Ref Range Status   Specimen Description BLOOD BLOOD LEFT ARM  Final    Special Requests   Final    BOTTLES DRAWN AEROBIC AND ANAEROBIC Blood Culture adequate volume   Culture   Final    NO GROWTH 4 DAYS Performed at [facility], [address], [place], [place] [address]    Report Status PENDING  Incomplete  Urine culture     Status: Abnormal   Collection Time: 02/11/19  1:10 PM   Specimen: In/Out Cath Urine  Result Value Ref Range Status   Specimen Description   Final    IN/OUT CATH URINE Performed at [facility], [address], [place], [place] [address]    Special Requests   Final    NONE Performed at [facility], [address] [place], [place] [address]    Culture (A)  Final    >=100,000 COLONIES/mL ESCHERICHIA COLI Confirmed Extended Spectrum Beta-Lactamase Producer (ESBL).  In bloodstream infections from ESBL organisms, carbapenems are preferred over piperacillin/tazobactam. They are shown to have a lower risk of mortality.    Report Status 02/14/2019 FINAL  Final   Organism ID, Bacteria ESCHERICHIA COLI (A)  Final      Susceptibility   Escherichia coli - MIC*    AMPICILLIN >=32 RESISTANT Resistant     CEFAZOLIN >=64 RESISTANT Resistant     CEFTRIAXONE >=64 RESISTANT Resistant     CIPROFLOXACIN >=4 RESISTANT Resistant     GENTAMICIN >=16 RESISTANT Resistant     IMIPENEM <=0.25 SENSITIVE Sensitive     NITROFURANTOIN <=16 SENSITIVE Sensitive     TRIMETH/SULFA >=320 RESISTANT Resistant     AMPICILLIN/SULBACTAM >=32 RESISTANT Resistant     PIP/TAZO 8 SENSITIVE Sensitive     Extended ESBL POSITIVE Resistant     * >=100,000 COLONIES/mL ESCHERICHIA COLI  SARS Coronavirus 2 by RT PCR (hospital order, performed in [place] hospital lab) Nasopharyngeal Nasopharyngeal Swab     Status: None   Collection Time: 02/11/19  1:10 PM   Specimen: Nasopharyngeal Swab  Result Value Ref Range Status   SARS Coronavirus 2 NEGATIVE NEGATIVE Final    Comment: (NOTE) If result is NEGATIVE SARS-CoV-2 target nucleic acids are  NOT DETECTED. The SARS-CoV-2 RNA is generally detectable in upper and lower  respiratory specimens during the acute phase of infection. The lowest  concentration of SARS-CoV-2 viral copies this assay can detect is 250  copies / mL. A negative result does not preclude SARS-CoV-2 infection  and should not be used as the sole basis for treatment or other  patient management decisions.  A negative result may occur with  improper specimen collection / handling, submission of specimen other  than nasopharyngeal swab, presence of viral mutation(s) within the  areas targeted by this assay, and inadequate number of viral copies  (<250 copies / mL). A negative result must be combined with clinical  observations, patient history, and epidemiological information. If result is POSITIVE SARS-CoV-2 target  nucleic acids are DETECTED. The SARS-CoV-2 RNA is generally detectable in upper and lower  respiratory specimens dur ing the acute phase of infection.  Positive  results are indicative of active infection with SARS-CoV-2.  Clinical  correlation with patient history and other diagnostic information is  necessary to determine patient infection status.  Positive results do  not rule out bacterial infection or co-infection with other viruses. If result is PRESUMPTIVE POSTIVE SARS-CoV-2 nucleic acids MAY BE PRESENT.   A presumptive positive result was obtained on the submitted specimen  and confirmed on repeat testing.  While 2019 novel coronavirus  (SARS-CoV-2) nucleic acids may be present in the submitted sample  additional confirmatory testing may be necessary for epidemiological  and / or clinical management purposes  to differentiate between  SARS-CoV-2 and other Sarbecovirus currently known to infect humans.  If clinically indicated additional testing with an alternate test  methodology [id number]) is advised. The SARS-CoV-2 RNA is generally  detectable in upper and lower respiratory sp ecimens  during the acute  phase of infection. The expected result is Negative. Fact Sheet for Patients:  StrictlyIdeas.no Fact Sheet for Healthcare Providers: BankingDealers.co.za This test is not yet approved or cleared by the [place] FDA and has been authorized for detection and/or diagnosis of SARS-CoV-2 by FDA under an Emergency Use Authorization (EUA).  This EUA will remain in effect (meaning this test can be used) for the duration of the COVID-19 declaration under Section 564(b)(1) of the Act, 21 U.S.C. section 360bbb-3(b)(1), unless the authorization is terminated or revoked sooner. Performed at [facility], [place], [place] [address]   SARS CORONAVIRUS 2 (TAT 6-24 HRS) Nasopharyngeal Nasopharyngeal Swab     Status: None   Collection Time: 02/13/19  5:20 PM   Specimen: Nasopharyngeal Swab  Result Value Ref Range Status   SARS Coronavirus 2 NEGATIVE NEGATIVE Final    Comment: (NOTE) SARS-CoV-2 target nucleic acids are NOT DETECTED. The SARS-CoV-2 RNA is generally detectable in upper and lower respiratory specimens during the acute phase of infection. Negative results do not preclude SARS-CoV-2 infection, do not rule out co-infections with other pathogens, and should not be used as the sole basis for treatment or other patient management decisions. Negative results must be combined with clinical observations, patient history, and epidemiological information. The expected result is Negative. Fact Sheet for Patients: SugarRoll.be Fact Sheet for Healthcare Providers: https://www.woods-mathews.com/ This test is not yet approved or cleared by the [place] FDA and  has been authorized for detection and/or diagnosis of SARS-CoV-2 by FDA under an Emergency Use Authorization (EUA). This EUA will remain  in effect (meaning this test can be used) for the duration of  the COVID-19 declaration under Section 56 4(b)(1) of the Act, 21 U.S.C. section 360bbb-3(b)(1), unless the authorization is terminated or revoked sooner. Performed at [facility], [place] [address], [place], [place] [address]     IMAGING RESULTS: I have personally reviewed the films ? Impression/Recommendation ? ?78 year old male with end-stage renal disease on dialysis.  Presented with fever and fatigue and some drowsiness.  Fever in a dialysis patient is always concerning for bloodstream infection.  Blood cultures have been negative so far.  And he received 1 dose of vancomycin and was given back ceftriaxone which was discontinued.  I wonder whether the vancomycin resolve the fever.  ESBL E. coli in the urine.  Doubt this is causing the fever or his altered status.  He makes very little urine every  day and ideally the urine should not have been tested.  We will get a post void bladder scan.  If no significant residue then may DC ertapenem  Hypertension on amlodipine    Hyperlipidemia on atorvastatin  Diabetes mellitus on insulin  BPH on tamsulosin ___________________________________________________ Discussed the management with the patient and his daughter and hospitalist. Note:  This document was prepared using Dragon voice recognition software and may include unintentional dictation errors.

## 2019-02-15 NOTE — Progress Notes (Addendum)
PT Cancellation Note  Patient Details Name: Gregory Davenport MRN: [id number] DOB: 1940/05/24   Cancelled Treatment:    Reason Eval/Treat Not Completed: Patient at procedure or test/unavailable   Transport tech in room upon arrival to take pt to dialysis.  Will continue as appropriate and attempt later as time allows.   Chesley Noon 02/15/2019, 9:30 AM

## 2019-02-15 NOTE — Progress Notes (Signed)
Post HD Tx note Pt tolerated well the HD Tx. Pt reports no chest pain or SOB. BP post termination is WDl. UF goal prescribed 1500 ml is met. Pt tx run for 3.5 as prescribed.  AVG is WDL

## 2019-02-15 NOTE — TOC Progression Note (Addendum)
Transition of Care [facility]) - Progression Note    Patient Details  Name: Gregory Davenport MRN: [id number] Date of Birth: 07-14-1940  Transition of Care [facility]) CM/SW Contact  Shelbie Hutching, RN Phone Number: 02/15/2019, 9:04 AM  Clinical Narrative:    RNCM spoke with patient's daughter, Milly Jakob, this morning.  Aw[facility] would like for patient to go to [facility] in [place].  RNCM will reach out to [facility] to see if bed is available.  Patient will have dialysis treatment today.  Elvera Bicker with [facility] is working on finding patient a dialysis facility in this area.    Expected Discharge Plan: [place] Barriers to Discharge: SNF Pending bed offer, Continued Medical Work up  Expected Discharge Plan and Services Expected Discharge Plan: [place] Choice: [place] arrangements for the past 2 months: [place], Single Family Home                                       Social Determinants of Health (SDOH) Interventions    Readmission Risk Interventions No flowsheet data found.

## 2019-02-15 NOTE — Progress Notes (Signed)
HD Tx completed   02/15/19 1254  Hand-Off documentation  Report given to (Full Name) Alric Quan RN   Report received from (Full Name) Newt Minion RN  Vital Signs  Temp 98.7 F (37.1 C)  Temp Source Oral  Pulse Rate 65  Pulse Rate Source Monitor  Resp 12  BP (!) 159/80  BP Location Left Arm  BP Method Automatic  Patient Position (if appropriate) Lying  Oxygen Therapy  SpO2 99 %  O2 Device Room Air  Pain Assessment  Pain Scale 0-10  Pain Score 0  During Hemodialysis Assessment  Blood Flow Rate (mL/min) 200 mL/min  Arterial Pressure (mmHg) 60 mmHg  Venous Pressure (mmHg) -20 mmHg  Transmembrane Pressure (mmHg) 20 mmHg  Ultrafiltration Rate (mL/min) 300 mL/min  Dialysate Flow Rate (mL/min) 600 ml/min  Conductivity: Machine  13.8  HD Safety Checks Performed Yes  KECN 78 KECN  Dialysis Fluid Bolus Normal Saline  Intra-Hemodialysis Comments Tx completed  Post-Hemodialysis Assessment  Rinseback Volume (mL) 250 mL  KECN 78 V  Dialyzer Clearance Lightly streaked  Duration of HD Treatment -hour(s) 3.5 hour(s)  Hemodialysis Intake (mL) 500 mL  UF Total -Machine (mL) 2000 mL  Net UF (mL) 1500 mL  Tolerated HD Treatment Yes  Post-Hemodialysis Comments  (tx complete with no issues)  AVG/AVF Arterial Site Held (minutes)  (5 minutes)  AVG/AVF Venous Site Held (minutes)  (5)

## 2019-02-15 NOTE — Plan of Care (Signed)
  Problem: Clinical Measurements: Goal: Ability to maintain clinical measurements within normal limits will improve Outcome: Progressing Goal: Will remain free from infection Outcome: Progressing Goal: Diagnostic test results will improve Outcome: Progressing Goal: Respiratory complications will improve Outcome: Progressing Goal: Cardiovascular complication will be avoided Outcome: Progressing   Problem: Nutrition: Goal: Adequate nutrition will be maintained Outcome: Progressing   Problem: Elimination: Goal: Will not experience complications related to bowel motility Outcome: Progressing Goal: Will not experience complications related to urinary retention Outcome: Progressing   Problem: Fluid Volume: Goal: Compliance with measures to maintain balanced fluid volume will improve Outcome: Progressing   Problem: Nutritional: Goal: Ability to make healthy dietary choices will improve Outcome: Progressing  Pt is familiar wit HD treatment. Pt's access and Vitals are WDL. Pt is ready to receive HD

## 2019-02-15 NOTE — Progress Notes (Signed)
Pre HD Tx   02/15/19 0919  Neurological  Level of Consciousness Alert  Orientation Level Oriented X4  Respiratory  Respiratory Pattern Regular;Unlabored  Chest Assessment Chest expansion symmetrical  Bilateral Breath Sounds Clear;Diminished  Cardiac  Cardiac Rhythm NSR  Vascular  R Radial Pulse +2  L Radial Pulse +2  R Dorsalis Pedis Pulse +1  L Dorsalis Pedis Pulse +1  Integumentary  Integumentary (WDL) X  Skin Condition Dry;Flaky  Gastrointestinal  Bowel Sounds Assessment Active  Last BM Date 02/15/19  GU Assessment  Genitourinary (WDL) X  Genitourinary Symptoms Oliguria  Psychosocial  Psychosocial (WDL) WDL

## 2019-02-15 NOTE — Progress Notes (Signed)
HD Treatment Started   02/15/19 0919  Vital Signs  Pulse Rate 64  Pulse Rate Source Monitor  Resp 18  BP (!) 164/83  BP Location Left Arm  BP Method Automatic  Patient Position (if appropriate) Lying  Oxygen Therapy  SpO2 98 %  O2 Device Room Air  Pain Assessment  Pain Scale 0-10  Pain Score 0  During Hemodialysis Assessment  Blood Flow Rate (mL/min) 400 mL/min  Arterial Pressure (mmHg) -140 mmHg  Venous Pressure (mmHg) 140 mmHg  Transmembrane Pressure (mmHg) 40 mmHg  Ultrafiltration Rate (mL/min) 570 mL/min  Dialysate Flow Rate (mL/min) 600 ml/min  Conductivity: Machine  14  HD Safety Checks Performed Yes  Dialysis Fluid Bolus Normal Saline  Bolus Amount (mL) 250 mL  Intra-Hemodialysis Comments Tx initiated

## 2019-02-15 NOTE — Progress Notes (Signed)
Post HD Tx assessment    02/15/19 1300  Neurological  Level of Consciousness Alert  Orientation Level Oriented X4  Respiratory  Respiratory Pattern Regular;Unlabored  Chest Assessment Chest expansion symmetrical  Bilateral Breath Sounds Clear;Diminished  Cardiac  Pulse Regular  Heart Sounds S1, S2  Jugular Venous Distention (JVD) No  ECG Monitor Yes  Cardiac Rhythm NSR  Vascular  R Radial Pulse +2  L Radial Pulse +2  R Dorsalis Pedis Pulse +1  L Dorsalis Pedis Pulse +1  Integumentary  Integumentary (WDL) X  Skin Condition Dry;Flaky  Gastrointestinal  Bowel Sounds Assessment Active  GU Assessment  Genitourinary (WDL) X  Genitourinary Symptoms Oliguria  Psychosocial  Psychosocial (WDL) WDL

## 2019-02-16 DIAGNOSIS — R509 Fever, unspecified: Secondary | ICD-10-CM

## 2019-02-16 DIAGNOSIS — R5383 Other fatigue: Secondary | ICD-10-CM

## 2019-02-16 LAB — CULTURE, BLOOD (ROUTINE X 2)
Culture: NO GROWTH
Culture: NO GROWTH
Special Requests: ADEQUATE
Special Requests: ADEQUATE

## 2019-02-16 LAB — GLUCOSE, CAPILLARY
Glucose-Capillary: 88 mg/dL (ref 70–99)
Glucose-Capillary: 159 mg/dL — ABNORMAL HIGH (ref 70–99)
Glucose-Capillary: 128 mg/dL — ABNORMAL HIGH (ref 70–99)
Glucose-Capillary: 118 mg/dL — ABNORMAL HIGH (ref 70–99)

## 2019-02-16 MED ORDER — TRAMADOL HCL 50 MG PO TABS
50.0000 mg | ORAL_TABLET | Freq: Three times a day (TID) | ORAL | Status: DC
  Administered 2019-02-16 – 2019-02-17 (×4): 50 mg via ORAL
  Filled 2019-02-16 (×4): qty 1

## 2019-02-16 NOTE — Progress Notes (Signed)
   Date of Admission:  02/11/2019    Subjective: No specific complaints  Medications:  .  stroke: mapping our early stages of recovery book   Does not apply Once  . amLODipine  10 mg Oral Daily  . aspirin EC  81 mg Oral Daily  . atorvastatin  40 mg Oral q1800  . brimonidine  1 drop Right Eye BID  . carvedilol  12.5 mg Oral BID  . Chlorhexidine Gluconate Cloth  6 each Topical Q0600  . dorzolamide-timolol  1 drop Both Eyes BID  . heparin injection (subcutaneous)  5,000 Units Subcutaneous Q8H  . insulin aspart  0-5 Units Subcutaneous QHS  . insulin aspart  0-9 Units Subcutaneous TID WC  . insulin glargine  8 Units Subcutaneous QHS  . pantoprazole  40 mg Oral Daily  . polyethylene glycol  17 g Oral Daily  . tamsulosin  0.4 mg Oral Daily  . traMADol  50 mg Oral TID    Objective: Vital signs in last 24 hours: Temp:  [97.9 F (36.6 C)-98.8 F (37.1 C)] 98.8 F (37.1 C) (10/15 1600) Pulse Rate:  [62-69] 66 (10/15 1600) Resp:  [16-18] 18 (10/15 1600) BP: (150-155)/(69-88) 155/73 (10/15 1600) SpO2:  [97 %-100 %] 97 % (10/15 1600)  PHYSICAL EXAM:  General: Alert, cooperative, no distress, appears stated age.  legally blind Lungs: Clear to auscultation bilaterally. No Wheezing or Rhonchi. No rales. Heart: Regular rate and rhythm, no murmur, rub or gallop. Abdomen: Soft, non-tender,not distended. Bowel sounds normal. No masses Extremities: atraumatic, no cyanosis. No edema. No clubbing Skin: No rashes or lesions. Or bruising Lymph: Cervical, supraclavicular normal. Neurologic: Grossly non-focal  CBC Latest Ref Rng & Units 02/12/2019 02/11/2019  WBC 4.0 - 10.5 K/uL 6.2 7.4  Hemoglobin 13.0 - 17.0 g/dL 11.8(L) 11.2(L)  Hematocrit 39.0 - 52.0 % 37.1(L) 35.8(L)  Platelets 150 - 400 K/uL 181 168   Microbiology: UC esbl e.coli BC neg  Studies/Results: No results found.   Assessment/Plan: Impression/Recommendation ? ?78 year old male with end-stage renal disease on  dialysis.  Presented with fever and fatigue and some drowsiness.  Fever in a dialysis patient is always concerning for bloodstream infection.  Blood cultures have been negative so far.  And he received 1 dose of vancomycin and  ceftriaxone which was discontinued.  I wonder whether the vancomycin resolved the fever.  ESBL E. coli in the urine.  Doubt this is causing the fever or his altered status.  He makes very little urine every day and ideally the urine should not have been tested.  post void scan only 65 cc - no significant residue - DC ertapenem  Hypertension on amlodipine   Hyperlipidemia on atorvastatin  Diabetes mellitus on insulin  BPH on tamsulosin  Observe patient off antibiotics- if fever recurs or altered status but  /hemodynamically stable Will not start antibiotics immediately but will observe him with cultures and see how he progress- Call ID then Will sign off now

## 2019-02-16 NOTE — TOC Progression Note (Signed)
Transition of Care [facility]) - Progression Note    Patient Details  Name: Gregory Davenport MRN: [id number] Date of Birth: 1940/07/10  [facility]) CM/SW Contact  Shelbie Hutching, RN Phone Number: 02/16/2019, 3:16 PM  Clinical Narrative:    Plan will be for discharge tomorrow to [facility] in [place].  Elvera Bicker, dialysis coordinator is working on setting up chair time for dialysis in [place].  [facility] will provide transportation on the weekend to dialysis if needed and does accept weekend admissions.  Discharge will only be held if chair time cannot be arranged for dialysis.  [facility] also requires new COVID test within 48 hours of admission.   Milly Jakob, patient's daughter has been updated on plan of care.  Patient is scheduled for dialysis tomorrow while here in the hospital.     Expected Discharge Plan: Skilled Nursing Facility Barriers to Discharge: SNF Pending bed offer, Continued Medical Work up  Expected Discharge Plan and Services Expected Discharge Plan: [place] Choice: [place] arrangements for the past 2 months: [place], Single Family Home                                       Social Determinants of Health (SDOH) Interventions    Readmission Risk Interventions No flowsheet data found.

## 2019-02-16 NOTE — Progress Notes (Signed)
[facility]  ROUNDING NOTE   Subjective:  Patient seen and evaluated bedside. Due for dialysis treatment again tomorrow. No acute complaints at the moment.    Objective:  Vital signs in last 24 hours:  Temp:  [97.9 F (36.6 C)-98.8 F (37.1 C)] 97.9 F (36.6 C) (10/15 0807) Pulse Rate:  [62-76] 62 (10/15 0807) Resp:  [16-20] 17 (10/15 0440) BP: (150-159)/(64-88) 152/88 (10/15 0807) SpO2:  [97 %-100 %] 100 % (10/15 0807)  Weight change:  Filed Weights   02/13/19 1315 02/14/19 0423 02/15/19 0915  Weight: 92.1 kg 91.3 kg 95 kg    Intake/Output: I/O last 3 completed shifts: In: 370 [P.O.:320; IV Piggyback:50] Out: [id number] [Urine:379; Other:3000]   Intake/Output this shift:  No intake/output data recorded.  Physical Exam: General: No acute distress  Head: Normocephalic, atraumatic. Moist oral mucosal membranes  Eyes: Anicteric  Neck: Supple, trachea midline  Lungs:  Clear to auscultation, normal effort  Heart: S1S2 no rubs  Abdomen:  Soft, nontender, bowel sounds present  Extremities: Trace peripheral edema.  Neurologic: Awake, alert, following commands  Skin: No lesions  Access: Right upper extremity AV graft    Basic Metabolic Panel: Recent Labs  Lab 02/11/19 1127 02/12/19 0713 02/13/19 0624  NA 136 138  --   K 3.6 3.7  --   CL 94* 96*  --   CO2 29 28  --   GLUCOSE 107* 116*  --   BUN 25* 33*  --   CREATININE 7.57* 9.29*  --   CALCIUM 9.1 9.0  --   PHOS  --   --  4.8*    Liver Function Tests: Recent Labs  Lab 02/11/19 1127  AST 18  ALT 15  ALKPHOS 58  BILITOT 0.8  PROT 7.3  ALBUMIN 3.6   No results for input(s): LIPASE, AMYLASE in the last 168 hours. Recent Labs  Lab 02/12/19 0713  AMMONIA 25    CBC: Recent Labs  Lab 02/11/19 1127 02/12/19 0713  WBC 7.4 6.2  NEUTROABS 4.0  --   HGB 11.2* 11.8*  HCT 35.8* 37.1*  MCV 100.3* 99.7  PLT 168 181    Cardiac Enzymes: No results for input(s): CKTOTAL, CKMB, CKMBINDEX,  TROPONINI in the last 168 hours.  BNP: Invalid input(s): POCBNP  CBG: Recent Labs  Lab 02/15/19 1345 02/15/19 1639 02/15/19 2038 02/16/19 0807 02/16/19 1216  GLUCAP 107* 143* 143* 55 159*    Microbiology: Results for orders placed or performed during the hospital encounter of 02/11/19  Blood Culture (routine x 2)     Status: None   Collection Time: 02/11/19 11:27 AM   Specimen: BLOOD  Result Value Ref Range Status   Specimen Description BLOOD BLOOD LEFT HAND  Final   Special Requests   Final    BOTTLES DRAWN AEROBIC AND ANAEROBIC Blood Culture adequate volume   Culture   Final    NO GROWTH 5 DAYS Performed at [facility], [address], [place], [place] [address]    Report Status 02/16/2019 FINAL  Final  Blood Culture (routine x 2)     Status: None   Collection Time: 02/11/19 11:28 AM   Specimen: BLOOD  Result Value Ref Range Status   Specimen Description BLOOD BLOOD LEFT ARM  Final   Special Requests   Final    BOTTLES DRAWN AEROBIC AND ANAEROBIC Blood Culture adequate volume   Culture   Final    NO GROWTH 5 DAYS Performed at [facility], [address] [place], [place] [address]    Report Status 02/16/2019 FINAL  Final  Urine culture     Status: Abnormal   Collection Time: 02/11/19  1:10 PM   Specimen: In/Out Cath Urine  Result Value Ref Range Status   Specimen Description   Final    IN/OUT CATH URINE Performed at [facility], [address] [place], [place] [address]    Special Requests   Final    NONE Performed at [facility], [address] [place], [place] [id number]    Culture (A)  Final    >=100,000 COLONIES/mL ESCHERICHIA COLI Confirmed Extended Spectrum Beta-Lactamase Producer (ESBL).  In bloodstream infections from ESBL organisms, carbapenems are preferred over piperacillin/tazobactam. They are shown to have a lower risk of mortality.    Report Status 02/14/2019 FINAL  Final   Organism ID, Bacteria  ESCHERICHIA COLI (A)  Final      Susceptibility   Escherichia coli - MIC*    AMPICILLIN >=32 RESISTANT Resistant     CEFAZOLIN >=64 RESISTANT Resistant     CEFTRIAXONE >=64 RESISTANT Resistant     CIPROFLOXACIN >=4 RESISTANT Resistant     GENTAMICIN >=16 RESISTANT Resistant     IMIPENEM <=0.25 SENSITIVE Sensitive     NITROFURANTOIN <=16 SENSITIVE Sensitive     TRIMETH/SULFA >=320 RESISTANT Resistant     AMPICILLIN/SULBACTAM >=32 RESISTANT Resistant     PIP/TAZO 8 SENSITIVE Sensitive     Extended ESBL POSITIVE Resistant     * >=100,000 COLONIES/mL ESCHERICHIA COLI  SARS Coronavirus 2 by RT PCR (hospital order, performed in [place] hospital lab) Nasopharyngeal Nasopharyngeal Swab     Status: None   Collection Time: 02/11/19  1:10 PM   Specimen: Nasopharyngeal Swab  Result Value Ref Range Status   SARS Coronavirus 2 NEGATIVE NEGATIVE Final    Comment: (NOTE) If result is NEGATIVE SARS-CoV-2 target nucleic acids are NOT DETECTED. The SARS-CoV-2 RNA is generally detectable in upper and lower  respiratory specimens during the acute phase of infection. The lowest  concentration of SARS-CoV-2 viral copies this assay can detect is 250  copies / mL. A negative result does not preclude SARS-CoV-2 infection  and should not be used as the sole basis for treatment or other  patient management decisions.  A negative result may occur with  improper specimen collection / handling, submission of specimen other  than nasopharyngeal swab, presence of viral mutation(s) within the  areas targeted by this assay, and inadequate number of viral copies  (<250 copies / mL). A negative result must be combined with clinical  observations, patient history, and epidemiological information. If result is POSITIVE SARS-CoV-2 target nucleic acids are DETECTED. The SARS-CoV-2 RNA is generally detectable in upper and lower  respiratory specimens dur ing the acute phase of infection.  Positive  results are  indicative of active infection with SARS-CoV-2.  Clinical  correlation with patient history and other diagnostic information is  necessary to determine patient infection status.  Positive results do  not rule out bacterial infection or co-infection with other viruses. If result is PRESUMPTIVE POSTIVE SARS-CoV-2 nucleic acids MAY BE PRESENT.   A presumptive positive result was obtained on the submitted specimen  and confirmed on repeat testing.  While 2019 novel coronavirus  (SARS-CoV-2) nucleic acids may be present in the submitted sample  additional confirmatory testing may be necessary for epidemiological  and / or clinical management purposes  to differentiate between  SARS-CoV-2 and other Sarbecovirus currently known  to infect humans.  If clinically indicated additional testing with an alternate test  methodology [phone]) is advised. The SARS-CoV-2 RNA is generally  detectable in upper and lower respiratory sp ecimens during the acute  phase of infection. The expected result is Negative. Fact Sheet for Patients:  StrictlyIdeas.no Fact Sheet for Healthcare Providers: BankingDealers.co.za This test is not yet approved or cleared by the [place] FDA and has been authorized for detection and/or diagnosis of SARS-CoV-2 by FDA under an Emergency Use Authorization (EUA).  This EUA will remain in effect (meaning this test can be used) for the duration of the COVID-19 declaration under Section 564(b)(1) of the Act, 21 U.S.C. section 360bbb-3(b)(1), unless the authorization is terminated or revoked sooner. Performed at [facility], [place], [place] [address]   SARS CORONAVIRUS 2 (TAT 6-24 HRS) Nasopharyngeal Nasopharyngeal Swab     Status: None   Collection Time: 02/13/19  5:20 PM   Specimen: Nasopharyngeal Swab  Result Value Ref Range Status   SARS Coronavirus 2 NEGATIVE NEGATIVE Final    Comment:  (NOTE) SARS-CoV-2 target nucleic acids are NOT DETECTED. The SARS-CoV-2 RNA is generally detectable in upper and lower respiratory specimens during the acute phase of infection. Negative results do not preclude SARS-CoV-2 infection, do not rule out co-infections with other pathogens, and should not be used as the sole basis for treatment or other patient management decisions. Negative results must be combined with clinical observations, patient history, and epidemiological information. The expected result is Negative. Fact Sheet for Patients: SugarRoll.be Fact Sheet for Healthcare Providers: https://www.woods-mathews.com/ This test is not yet approved or cleared by the [place] FDA and  has been authorized for detection and/or diagnosis of SARS-CoV-2 by FDA under an Emergency Use Authorization (EUA). This EUA will remain  in effect (meaning this test can be used) for the duration of the COVID-19 declaration under Section 56 4(b)(1) of the Act, 21 U.S.C. section 360bbb-3(b)(1), unless the authorization is terminated or revoked sooner. Performed at [facility], [place] [address], [place], [place] [address]     Coagulation Studies: No results for input(s): LABPROT, INR in the last 72 hours.  Urinalysis: No results for input(s): COLORURINE, LABSPEC, PHURINE, GLUCOSEU, HGBUR, BILIRUBINUR, KETONESUR, PROTEINUR, UROBILINOGEN, NITRITE, LEUKOCYTESUR in the last 72 hours.  Invalid input(s): APPERANCEUR    Imaging: No results found.   Medications:   . sodium chloride Stopped (02/12/19 1640)   .  stroke: mapping our early stages of recovery book   Does not apply Once  . amLODipine  10 mg Oral Daily  . aspirin EC  81 mg Oral Daily  . atorvastatin  40 mg Oral q1800  . brimonidine  1 drop Right Eye BID  . carvedilol  12.5 mg Oral BID  . Chlorhexidine Gluconate Cloth  6 each Topical Q0600  . dorzolamide-timolol  1 drop Both Eyes BID   . heparin injection (subcutaneous)  5,000 Units Subcutaneous Q8H  . insulin aspart  0-5 Units Subcutaneous QHS  . insulin aspart  0-9 Units Subcutaneous TID WC  . insulin glargine  8 Units Subcutaneous QHS  . pantoprazole  40 mg Oral Daily  . polyethylene glycol  17 g Oral Daily  . tamsulosin  0.4 mg Oral Daily  . traMADol  50 mg Oral TID   sodium chloride, acetaminophen  Assessment/ Plan:  78 y.o. male  with Diabetes, HTN, ESRD, CAD was admitted on 02/11/2019 with generalized weakness, confusion and fever.   1.  ESRD on HD MWF followed by [facility] nephrology. Patient had dialysis yesterday.  No acute indication for dialysis at the moment.  Plan for dialysis tomorrow if still here.  2.  Anemia of chronic kidney disease.  Patient to transition to [facility] as an outpatient.  3.  Secondary hyperparathyroidism.  Repeat serum phosphorus tomorrow.  4.  Hypertension.  Continue amlodipine and carvedilol..    LOS: 5 Yenifer Saccente 10/15/20202:10 PM

## 2019-02-16 NOTE — Progress Notes (Signed)
[facility] [place] NAME: Gregory Davenport    MR#:  [id number]  DATE OF BIRTH:  08/16/1940  SUBJECTIVE:  CHIEF COMPLAINT:   Chief Complaint  Patient presents with  . Altered Mental Status   Came with right-sided weakness.  Currently feels weakness is improving much. Noted no acute stroke on MRI.  I have seen him in room today.  He appeared back to his baseline as last few days I have seen. Have c/o pain in left foot.  REVIEW OF SYSTEMS:  CONSTITUTIONAL: No fever, fatigue or weakness.  EYES: No blurred or double vision.  EARS, NOSE, AND THROAT: No tinnitus or ear pain.  RESPIRATORY: No cough, shortness of breath, wheezing or hemoptysis.  CARDIOVASCULAR: No chest pain, orthopnea, edema.  GASTROINTESTINAL: No nausea, vomiting, diarrhea or abdominal pain.  GENITOURINARY: No dysuria, hematuria.  ENDOCRINE: No polyuria, nocturia,  HEMATOLOGY: No anemia, easy bruising or bleeding SKIN: No rash or lesion. MUSCULOSKELETAL: No joint pain or arthritis.   NEUROLOGIC: No tingling, numbness, weakness.  PSYCHIATRY: No anxiety or depression.   ROS  DRUG ALLERGIES:   Allergies  Allergen Reactions  . Other Other (See Comments)    Combative with opioid medications    VITALS:  Blood pressure (!) 152/88, pulse 62, temperature 97.9 F (36.6 C), temperature source Oral, resp. rate 17, height 6' (1.829 m), weight 95 kg, SpO2 100 %.  PHYSICAL EXAMINATION:  GENERAL:  78 y.o.-year-old patient lying in the bed with no acute distress.  EYES: Pupils equal, round, reactive to light and accommodation. No scleral icterus. Extraocular muscles intact.  HEENT: Head atraumatic, normocephalic. Oropharynx and nasopharynx clear.  NECK:  Supple, no jugular venous distention. No thyroid enlargement, no tenderness.  LUNGS: Normal breath sounds bilaterally, no wheezing, rales,rhonchi or crepitation. No use of accessory muscles of respiration.  CARDIOVASCULAR: S1, S2  normal. No murmurs, rubs, or gallops.  ABDOMEN: Soft, nontender, nondistended. Bowel sounds present. No organomegaly or mass.  EXTREMITIES: No pedal edema, cyanosis, or clubbing.  NEUROLOGIC: Cranial nerves II through XII are intact. Muscle strength 4/5 in all extremities-somewhat decreased in the right side but able to move and follows,. Sensation intact. Gait not checked.  Left foot does not have any focal point of tenderness.  No redness or skin changes. PSYCHIATRIC: The patient is alert and oriented x 3.  SKIN: No obvious rash, lesion, or ulcer.   Physical Exam LABORATORY PANEL:   CBC Recent Labs  Lab 02/12/19 0713  WBC 6.2  HGB 11.8*  HCT 37.1*  PLT 181   ------------------------------------------------------------------------------------------------------------------  Chemistries  Recent Labs  Lab 02/11/19 1127 02/12/19 0713  NA 136 138  K 3.6 3.7  CL 94* 96*  CO2 29 28  GLUCOSE 107* 116*  BUN 25* 33*  CREATININE 7.57* 9.29*  CALCIUM 9.1 9.0  AST 18  --   ALT 15  --   ALKPHOS 58  --   BILITOT 0.8  --    ------------------------------------------------------------------------------------------------------------------  Cardiac Enzymes No results for input(s): TROPONINI in the last 168 hours. ------------------------------------------------------------------------------------------------------------------  RADIOLOGY:  No results found.  ASSESSMENT AND PLAN:   Active Problems:   Right sided weakness  1.  Right-sided weakness starting  after dialysis.  CT scan of the head negative.   MRI brain is negative.  Carotid Doppler study did not show significant acute blockages.  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   LDL is  95, on atorvastatin 40 mg. Continue aspirin.   Swallow screen.  Physical therapy occupational therapy evaluations.  Monitor on telemetry.  Appreciate neurology consult. PT suggested SNF  placement.  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 As he is a dialysis patient we also need to have his dialysis arranged locally while discharging to rehab.   2.  Fever with normal white count.  Urine analysis does look positive   Follow-up blood cultures are negative,  Chest x-ray negative.     COVID-19 negative. He was given IV ceftriaxone for possible UTI.  But his urine culture now reported ESBL.  He does not have any more fever for last 3 days and feeling much better and back to normal so I will stop ceftriaxone as it is not effective and ESBL anyways.   Patient was started on ertapenem for 1 dose by covering physician but after having a consult with infectious disease, she also agreed that patient does not need to be treated for ESBL as patient have only 54 mL residual urine in bladder post void scan, and patient does not make much urine due to being on dialysis, this ESBL in urine seems to be colonization and does not need to be treated.  3.  End-stage renal disease on dialysis Monday Wednesday Friday.  Appreciated Consult nephrology.  4.  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  Decrease dose of Lantus down to 8 units and sliding scale.  Checked a hemoglobin A1c-6.9.  5.  Essential hypertension.  Continue usual medications. Allow permissive hypertension on first day and started on amlodipine.  Physical therapy suggested SNF placement.   All the records are reviewed and case discussed with Care Management/Social Workerr. Management plans discussed with the patient, family and they are in agreement.  CODE STATUS: Full  TOTAL TIME TAKING CARE OF THIS PATIENT: 35 minutes.  Likely discharge once we have approval.  Need also arrangement for hemodialysis.   POSSIBLE D/C IN 1-2 DAYS, DEPENDING ON CLINICAL CONDITION.   Vaughan Basta M.D on 02/16/2019   Between 7am to 6pm - Pager - [phone]  After 6pm go to [email or web address] - password [email or web address]  [facility]  Office  [phone]  CC: Primary care physician; Delilah Shan, MD  Note: This dictation was prepared with Dragon dictation along with smaller phrase technology. Any transcriptional errors that result from this process are unintentional.

## 2019-02-16 NOTE — TOC Progression Note (Signed)
Transition of Care [facility]) - Progression Note    Patient Details  Name: Gregory Davenport MRN: [id number] Date of Birth: 1940/06/28  [facility]) CM/SW Contact  Shelbie Hutching, RN Phone Number: 02/16/2019, 9:41 AM  Clinical Narrative:    Dialysis Coordinator is still working on getting patient an outpatient dialysis chair time.  Patient's daughter voiced that [facility] in [place] is a 2nd choice if [facility] in [place] is unable to accept patient's due to [place].     Expected Discharge Plan: [place] Barriers to Discharge: SNF Pending bed offer, Continued Medical Work up  Expected Discharge Plan and Services Expected Discharge Plan: [place] Choice: [place] arrangements for the past 2 months: [place], Single Family Home                                       Social Determinants of Health (SDOH) Interventions    Readmission Risk Interventions No flowsheet data found.

## 2019-02-16 NOTE — Progress Notes (Signed)
Occupational Therapy Treatment Patient Details Name: Gregory Davenport MRN: [id number] DOB: 01/20/1941 Today's Date: 02/16/2019    History of present illness From MD H&P: Pt is an 78 y.o. male with a known history of end-stage renal disease.  He states that he feels terrible. After dialysis yesterday he felt a little bit weak.  He did have some confusion.  She felt that his right side was weaker. Today they could not even transfer him.  Normally is able to walk a little bit with a walker.  Urine analysis looked positive. Patient also having right-sided weakness and can hardly lift his right leg up off the bed and his grip strength is a little weaker with his right arm.  Hospitalist services contacted for further evaluation.  Per MD assessment CT and MRI both (-) for CVA.  Assessment includes: Right-sided weakness, fever with normal white count, ESRD on HD M,W,F, DM II, and HTN.   OT comments  Pt. education was provided about strategies for low vision during a meal. Pt. education was provided about the clock method for locating items on his plate, orginization of items, and positioning. Reviewed pt.'s daily routines at home, and the assist required,as well as strategies for low vision during daily tasks at home.   Pt. continues to benefit from OT services for ADL training, A/E training, and pt. education about low vision, work simplification strategies, home modification, and DME. Pt. continues to be appropriate for SNF level of care upon discharge.   Follow Up Recommendations  SNF    Equipment Recommendations  Other (comment)    Recommendations for Other Services      Precautions / Restrictions         Mobility Bed Mobility                  Transfers      deferred                Balance                                           ADL either performed or assessed with clinical judgement   ADL Overall ADL's : Needs assistance/impaired   Eating/Feeding  Details (indicate cue type and reason): Pt. edcuation was provided with edcuation about Low vision strategies during self-feeding.                                         Vision Baseline Vision/History: Legally blind Patient Visual Report: No change from baseline     Perception     Praxis      Cognition Arousal/Alertness: Awake/alert Behavior During Therapy: WFL for tasks assessed/performed Overall Cognitive Status: Within Functional Limits for tasks assessed                                          Exercises     Shoulder Instructions       General Comments      Pertinent Vitals/ Pain       Pain Assessment: No/denies pain  Home Living  Prior Functioning/Environment              Frequency  Min 1X/week        Progress Toward Goals  OT Goals(current goals can now be found in the care plan section)  Progress towards OT goals: Progressing toward goals  Acute Rehab OT Goals Patient Stated Goal: go home OT Goal Formulation: With patient Potential to Achieve Goals: Good  Plan Discharge plan remains appropriate    Co-evaluation                 AM-PAC OT "6 Clicks" Daily Activity     Outcome Measure   Help from another person eating meals?: A Little Help from another person taking care of personal grooming?: A Little Help from another person toileting, which includes using toliet, bedpan, or urinal?: A Little Help from another person bathing (including washing, rinsing, drying)?: A Little Help from another person to put on and taking off regular upper body clothing?: A Little Help from another person to put on and taking off regular lower body clothing?: A Little 6 Click Score: 18    End of Session    OT Visit Diagnosis: Muscle weakness (generalized) (M62.81);Low vision, both eyes (H54.2)   Activity Tolerance Patient tolerated treatment well   Patient  Left in chair;with call bell/phone within reach;with chair alarm set   Nurse Communication          Time: 1325-1340 OT Time Calculation (min): 15 min  Charges: OT General Charges $OT Visit: 1 Visit OT Treatments $Self Care/Home Management : 8-22 mins  Harrel Carina, MS, OTR/L  Harrel Carina 02/16/2019, 1:52 PM

## 2019-02-17 LAB — CBC
HCT: 34.1 % — ABNORMAL LOW (ref 39.0–52.0)
Hemoglobin: 10.8 g/dL — ABNORMAL LOW (ref 13.0–17.0)
MCH: 30.9 pg (ref 26.0–34.0)
MCHC: 31.7 g/dL (ref 30.0–36.0)
MCV: 97.7 fL (ref 80.0–100.0)
Platelets: 230 10*3/uL (ref 150–400)
RBC: 3.49 MIL/uL — ABNORMAL LOW (ref 4.22–5.81)
RDW: 13.9 % (ref 11.5–15.5)
WBC: 4.8 10*3/uL (ref 4.0–10.5)
nRBC: 0 % (ref 0.0–0.2)

## 2019-02-17 LAB — PHOSPHORUS: Phosphorus: 4.9 mg/dL — ABNORMAL HIGH (ref 2.5–4.6)

## 2019-02-17 LAB — GLUCOSE, CAPILLARY
Glucose-Capillary: 133 mg/dL — ABNORMAL HIGH (ref 70–99)
Glucose-Capillary: 172 mg/dL — ABNORMAL HIGH (ref 70–99)

## 2019-02-17 LAB — SARS CORONAVIRUS 2 (TAT 6-24 HRS): SARS Coronavirus 2: NEGATIVE

## 2019-02-17 MED ORDER — ASPIRIN 81 MG PO TBEC: 81.0000 mg | DELAYED_RELEASE_TABLET | Freq: Every day | ORAL | 0 refills | Status: AC

## 2019-02-17 MED ORDER — INSULIN GLARGINE 100 UNIT/ML ~~LOC~~ SOLN: 8.0000 [IU] | Freq: Every day | SUBCUTANEOUS | 11 refills | Status: AC

## 2019-02-17 NOTE — Progress Notes (Signed)
[facility]  ROUNDING NOTE   Subjective:  Patient completed dialysis treatment today. Tolerated well. Resting comfortably.    Objective:  Vital signs in last 24 hours:  Temp:  [97.6 F (36.4 C)-98.8 F (37.1 C)] 98.5 F (36.9 C) (10/16 1415) Pulse Rate:  [60-72] 72 (10/16 1415) Resp:  [11-18] 15 (10/16 1415) BP: (116-163)/(55-73) 130/66 (10/16 1415) SpO2:  [96 %-99 %] 98 % (10/16 1415)  Weight change:  Filed Weights   02/13/19 1315 02/14/19 0423 02/15/19 0915  Weight: 92.1 kg 91.3 kg 95 kg    Intake/Output: I/O last 3 completed shifts: In: 180 [P.O.:180] Out: 454 [Urine:454]   Intake/Output this shift:  Total I/O In: -  Out: 1513 [Other:1513]  Physical Exam: General: No acute distress  Head: Normocephalic, atraumatic. Moist oral mucosal membranes  Eyes: Anicteric  Neck: Supple, trachea midline  Lungs:  Clear to auscultation, normal effort  Heart: S1S2 no rubs  Abdomen:  Soft, nontender, bowel sounds present  Extremities: Trace peripheral edema.  Neurologic: Awake, alert, following commands  Skin: No lesions  Access: Right upper extremity AV graft    Basic Metabolic Panel: Recent Labs  Lab 02/11/19 1127 02/12/19 0713 02/13/19 0624 02/17/19 0557  NA 136 138  --   --   K 3.6 3.7  --   --   CL 94* 96*  --   --   CO2 29 28  --   --   GLUCOSE 107* 116*  --   --   BUN 25* 33*  --   --   CREATININE 7.57* 9.29*  --   --   CALCIUM 9.1 9.0  --   --   PHOS  --   --  4.8* 4.9*    Liver Function Tests: Recent Labs  Lab 02/11/19 1127  AST 18  ALT 15  ALKPHOS 58  BILITOT 0.8  PROT 7.3  ALBUMIN 3.6   No results for input(s): LIPASE, AMYLASE in the last 168 hours. Recent Labs  Lab 02/12/19 0713  AMMONIA 25    CBC: Recent Labs  Lab 02/11/19 1127 02/12/19 0713 02/17/19 0557  WBC 7.4 6.2 4.8  NEUTROABS 4.0  --   --   HGB 11.2* 11.8* 10.8*  HCT 35.8* 37.1* 34.1*  MCV 100.3* 99.7 97.7  PLT 168 181 230    Cardiac Enzymes: No  results for input(s): CKTOTAL, CKMB, CKMBINDEX, TROPONINI in the last 168 hours.  BNP: Invalid input(s): POCBNP  CBG: Recent Labs  Lab 02/16/19 0807 02/16/19 1216 02/16/19 1734 02/16/19 2153 02/17/19 0751  GLUCAP 88 159* 128* 118* 133*    Microbiology: Results for orders placed or performed during the hospital encounter of 02/11/19  Blood Culture (routine x 2)     Status: None   Collection Time: 02/11/19 11:27 AM   Specimen: BLOOD  Result Value Ref Range Status   Specimen Description BLOOD BLOOD LEFT HAND  Final   Special Requests   Final    BOTTLES DRAWN AEROBIC AND ANAEROBIC Blood Culture adequate volume   Culture   Final    NO GROWTH 5 DAYS Performed at [facility], [address], [place], [place] [address]    Report Status 02/16/2019 FINAL  Final  Blood Culture (routine x 2)     Status: None   Collection Time: 02/11/19 11:28 AM   Specimen: BLOOD  Result Value Ref Range Status   Specimen Description BLOOD BLOOD LEFT ARM  Final   Special Requests   Final  BOTTLES DRAWN AEROBIC AND ANAEROBIC Blood Culture adequate volume   Culture   Final    NO GROWTH 5 DAYS Performed at [facility], [address] [place], [place] [address]    Report Status 02/16/2019 FINAL  Final  Urine culture     Status: Abnormal   Collection Time: 02/11/19  1:10 PM   Specimen: In/Out Cath Urine  Result Value Ref Range Status   Specimen Description   Final    IN/OUT CATH URINE Performed at [facility], [address], [place], [place] [address]    Special Requests   Final    NONE Performed at [facility], [address] [place], [place] [address]    Culture (A)  Final    >=100,000 COLONIES/mL ESCHERICHIA COLI Confirmed Extended Spectrum Beta-Lactamase Producer (ESBL).  In bloodstream infections from ESBL organisms, carbapenems are preferred over piperacillin/tazobactam. They are shown to have a lower risk of mortality.    Report Status  02/14/2019 FINAL  Final   Organism ID, Bacteria ESCHERICHIA COLI (A)  Final      Susceptibility   Escherichia coli - MIC*    AMPICILLIN >=32 RESISTANT Resistant     CEFAZOLIN >=64 RESISTANT Resistant     CEFTRIAXONE >=64 RESISTANT Resistant     CIPROFLOXACIN >=4 RESISTANT Resistant     GENTAMICIN >=16 RESISTANT Resistant     IMIPENEM <=0.25 SENSITIVE Sensitive     NITROFURANTOIN <=16 SENSITIVE Sensitive     TRIMETH/SULFA >=320 RESISTANT Resistant     AMPICILLIN/SULBACTAM >=32 RESISTANT Resistant     PIP/TAZO 8 SENSITIVE Sensitive     Extended ESBL POSITIVE Resistant     * >=100,000 COLONIES/mL ESCHERICHIA COLI  SARS Coronavirus 2 by RT PCR (hospital order, performed in [place] hospital lab) Nasopharyngeal Nasopharyngeal Swab     Status: None   Collection Time: 02/11/19  1:10 PM   Specimen: Nasopharyngeal Swab  Result Value Ref Range Status   SARS Coronavirus 2 NEGATIVE NEGATIVE Final    Comment: (NOTE) If result is NEGATIVE SARS-CoV-2 target nucleic acids are NOT DETECTED. The SARS-CoV-2 RNA is generally detectable in upper and lower  respiratory specimens during the acute phase of infection. The lowest  concentration of SARS-CoV-2 viral copies this assay can detect is 250  copies / mL. A negative result does not preclude SARS-CoV-2 infection  and should not be used as the sole basis for treatment or other  patient management decisions.  A negative result may occur with  improper specimen collection / handling, submission of specimen other  than nasopharyngeal swab, presence of viral mutation(s) within the  areas targeted by this assay, and inadequate number of viral copies  (<250 copies / mL). A negative result must be combined with clinical  observations, patient history, and epidemiological information. If result is POSITIVE SARS-CoV-2 target nucleic acids are DETECTED. The SARS-CoV-2 RNA is generally detectable in upper and lower  respiratory specimens dur ing the acute  phase of infection.  Positive  results are indicative of active infection with SARS-CoV-2.  Clinical  correlation with patient history and other diagnostic information is  necessary to determine patient infection status.  Positive results do  not rule out bacterial infection or co-infection with other viruses. If result is PRESUMPTIVE POSTIVE SARS-CoV-2 nucleic acids MAY BE PRESENT.   A presumptive positive result was obtained on the submitted specimen  and confirmed on repeat testing.  While 2019 novel coronavirus  (SARS-CoV-2) nucleic acids may be present in the submitted  sample  additional confirmatory testing may be necessary for epidemiological  and / or clinical management purposes  to differentiate between  SARS-CoV-2 and other Sarbecovirus currently known to infect humans.  If clinically indicated additional testing with an alternate test  methodology [phone]) is advised. The SARS-CoV-2 RNA is generally  detectable in upper and lower respiratory sp ecimens during the acute  phase of infection. The expected result is Negative. Fact Sheet for Patients:  StrictlyIdeas.no Fact Sheet for Healthcare Providers: BankingDealers.co.za This test is not yet approved or cleared by the [place] FDA and has been authorized for detection and/or diagnosis of SARS-CoV-2 by FDA under an Emergency Use Authorization (EUA).  This EUA will remain in effect (meaning this test can be used) for the duration of the COVID-19 declaration under Section 564(b)(1) of the Act, 21 U.S.C. section 360bbb-3(b)(1), unless the authorization is terminated or revoked sooner. Performed at [facility], [place],  [address]   SARS CORONAVIRUS 2 (TAT 6-24 HRS) Nasopharyngeal Nasopharyngeal Swab     Status: None   Collection Time: 02/13/19  5:20 PM   Specimen: Nasopharyngeal Swab  Result Value Ref Range Status   SARS Coronavirus 2  NEGATIVE NEGATIVE Final    Comment: (NOTE) SARS-CoV-2 target nucleic acids are NOT DETECTED. The SARS-CoV-2 RNA is generally detectable in upper and lower respiratory specimens during the acute phase of infection. Negative results do not preclude SARS-CoV-2 infection, do not rule out co-infections with other pathogens, and should not be used as the sole basis for treatment or other patient management decisions. Negative results must be combined with clinical observations, patient history, and epidemiological information. The expected result is Negative. Fact Sheet for Patients: SugarRoll.be Fact Sheet for Healthcare Providers: https://www.woods-mathews.com/ This test is not yet approved or cleared by the [place] FDA and  has been authorized for detection and/or diagnosis of SARS-CoV-2 by FDA under an Emergency Use Authorization (EUA). This EUA will remain  in effect (meaning this test can be used) for the duration of the COVID-19 declaration under Section 56 4(b)(1) of the Act, 21 U.S.C. section 360bbb-3(b)(1), unless the authorization is terminated or revoked sooner. Performed at [facility], [place] [address], [place], [place] [address]   SARS CORONAVIRUS 2 (TAT 6-24 HRS) Nasopharyngeal Nasopharyngeal Swab     Status: None   Collection Time: 02/16/19  4:37 PM   Specimen: Nasopharyngeal Swab  Result Value Ref Range Status   SARS Coronavirus 2 NEGATIVE NEGATIVE Final    Comment: (NOTE) SARS-CoV-2 target nucleic acids are NOT DETECTED. The SARS-CoV-2 RNA is generally detectable in upper and lower respiratory specimens during the acute phase of infection. Negative results do not preclude SARS-CoV-2 infection, do not rule out co-infections with other pathogens, and should not be used as the sole basis for treatment or other patient management decisions. Negative results must be combined with clinical observations, patient history,  and epidemiological information. The expected result is Negative. Fact Sheet for Patients: SugarRoll.be Fact Sheet for Healthcare Providers: https://www.woods-mathews.com/ This test is not yet approved or cleared by the [place] FDA and  has been authorized for detection and/or diagnosis of SARS-CoV-2 by FDA under an Emergency Use Authorization (EUA). This EUA will remain  in effect (meaning this test can be used) for the duration of the COVID-19 declaration under Section 56 4(b)(1) of the Act, 21 U.S.C. section 360bbb-3(b)(1), unless the authorization is terminated or revoked sooner. Performed at [facility], [place] [address],  [place], [place] [address]     Coagulation Studies: No results for input(s): LABPROT, INR in the last 72 hours.  Urinalysis: No results for input(s): COLORURINE, LABSPEC, PHURINE, GLUCOSEU, HGBUR, BILIRUBINUR, KETONESUR, PROTEINUR, UROBILINOGEN, NITRITE, LEUKOCYTESUR in the last 72 hours.  Invalid input(s): APPERANCEUR    Imaging: No results found.   Medications:   . sodium chloride Stopped (02/12/19 1640)   .  stroke: mapping our early stages of recovery book   Does not apply Once  . amLODipine  10 mg Oral Daily  . aspirin EC  81 mg Oral Daily  . atorvastatin  40 mg Oral q1800  . brimonidine  1 drop Right Eye BID  . carvedilol  12.5 mg Oral BID  . Chlorhexidine Gluconate Cloth  6 each Topical Q0600  . dorzolamide-timolol  1 drop Both Eyes BID  . heparin injection (subcutaneous)  5,000 Units Subcutaneous Q8H  . insulin aspart  0-5 Units Subcutaneous QHS  . insulin aspart  0-9 Units Subcutaneous TID WC  . insulin glargine  8 Units Subcutaneous QHS  . pantoprazole  40 mg Oral Daily  . polyethylene glycol  17 g Oral Daily  . tamsulosin  0.4 mg Oral Daily  . traMADol  50 mg Oral TID   sodium chloride, acetaminophen  Assessment/ Plan:  78 y.o. male  with Diabetes, HTN, ESRD, CAD was admitted  on 02/11/2019 with generalized weakness, confusion and fever.   1.  ESRD on HD MWF followed by [facility] nephrology. Patient completed dialysis today.  Tolerated well.  Next dialysis treatment on Monday if still here.  2.  Anemia of chronic kidney disease.  Hemoglobin at target at 10.8.  Consider Mircera as an outpatient.  3.  Secondary hyperparathyroidism.  Phosphorus 4.9 and at target.  4.  Hypertension.  Maintain the patient on amlodipine and carvedilol for blood pressure control.    LOS: 6 Mabel Roll 10/16/20203:07 PM

## 2019-02-17 NOTE — Progress Notes (Signed)
Post HD Tx assessment   02/17/19 1500  Neurological  Level of Consciousness Alert  Orientation Level Oriented X4  Respiratory  Respiratory Pattern Regular;Unlabored  Chest Assessment Chest expansion symmetrical  Bilateral Breath Sounds Clear;Diminished  R Upper  Breath Sounds Clear;Diminished  L Upper Breath Sounds Clear  R Lower Breath Sounds Diminished  L Lower Breath Sounds Diminished  Cardiac  Pulse Regular  Heart Sounds S1, S2  Jugular Venous Distention (JVD) No  ECG Monitor Yes  Cardiac Rhythm NSR  Antiarrhythmic device No  Vascular  R Radial Pulse +2  L Radial Pulse +2  R Dorsalis Pedis Pulse +1  L Dorsalis Pedis Pulse +1  Integumentary  Integumentary (WDL) X  Skin Condition Dry;Flaky  Musculoskeletal  Musculoskeletal (WDL) X  Generalized Weakness Yes  Gastrointestinal  Bowel Sounds Assessment Hypoactive  GU Assessment  Genitourinary (WDL) X  Genitourinary Symptoms Oliguria (hemodialysis patient)  Psychosocial  Psychosocial (WDL) WDL

## 2019-02-17 NOTE — Progress Notes (Signed)
OT Cancellation Note  Patient Details Name: Shiheem Koleszar MRN: [id number] DOB: 18-Nov-1940   Cancelled Treatment:    Reason Eval/Treat Not Completed: Patient at procedure or test/ unavailable. Pt out for dialysis. Will re-attempt OT tx at later date/time as appropriate.   Jeni Salles, MPH, MS, OTR/L ascom [phone] 02/17/19, 12:01 PM

## 2019-02-17 NOTE — Progress Notes (Signed)
Physical Therapy Treatment Patient Details Name: Gregory Davenport MRN: [id number] DOB: 16-Aug-1940 Today's Date: 02/17/2019    History of Present Illness 78 y.o. male with a known history of end-stage renal disease.  He states that he feels terrible. After dialysis yesterday he felt a little bit weak.  He did have some confusion.  She felt that his right side was weaker. Today they could not even transfer him.  Normally is able to walk a little bit with a walker.  Urine analysis looked positive. Patient also having right-sided weakness and can hardly lift his right leg up off the bed and his grip strength is a little weaker with his right arm.  Hospitalist services contacted for further evaluation.  Per MD assessment CT and MRI both (-) for CVA.  Assessment includes: Right-sided weakness, fever with normal white count, ESRD on HD M,W,F, DM II, and HTN.    PT Comments    Pt was pleasant t/o the PT session and willing to do some activity.  However he did fatigue quickly with modest bout of ambulation and needed constant cuing/guidance secondary to vision issues.  He was able to participate with basic seated LE exercises with good effort but slow and inconsistent exercises.   Pt scheduled to go to dialysis later this AM and did not want to over do it this AM.    Follow Up Recommendations  SNF     Equipment Recommendations  None recommended by PT    Recommendations for Other Services       Precautions / Restrictions Precautions Precautions: Fall Restrictions Weight Bearing Restrictions: No Other Position/Activity Restrictions: Pt is legally blind    Mobility  Bed Mobility               General bed mobility comments: NT, pt in recliner  Transfers Overall transfer level: Modified independent Equipment used: Rolling walker (2 wheeled) Transfers: Sit to/from Stand Sit to Stand: Supervision         General transfer comment: Pt was able to rise w/o assist, showed appropriate use of  UEs  Ambulation/Gait Ambulation/Gait assistance: Min assist Gait Distance (Feet): 35 Feet Assistive device: Rolling walker (2 wheeled)       General Gait Details: Pt was able to rise and walk to/from door with walker.  He fatigued with the effort and despite cuing to try more needed to sit at that point.  He needed directional cuing and assist to manipulate walker to avoid obstacles secondary to vision impairments but was otherwise relative safe despite stooped posture and slow cadence.   Pt more interested in breakfast than another bout of walking despite cuing.   Stairs             Wheelchair Mobility    Modified Rankin (Stroke Patients Only)       Balance Overall balance assessment: Needs assistance Sitting-balance support: Feet supported Sitting balance-Leahy Scale: Good     Standing balance support: Bilateral upper extremity supported Standing balance-Leahy Scale: Fair Standing balance comment: reliant on walker but no overt balance/safety issues                            Cognition Arousal/Alertness: Awake/alert Behavior During Therapy: WFL for tasks assessed/performed Overall Cognitive Status: Within Functional Limits for tasks assessed  Exercises Total Joint Exercises Ankle Circles/Pumps: Strengthening;10 reps Hip ABduction/ADduction: Strengthening;10 reps Long Arc Quad: Strengthening;10 reps Knee Flexion: Strengthening;10 reps Marching in Standing: Seated;Strengthening;10 reps    General Comments        Pertinent Vitals/Pain Pain Assessment: No/denies pain    Home Living                      Prior Function            PT Goals (current goals can now be found in the care plan section) Progress towards PT goals: Progressing toward goals    Frequency    Min 2X/week      PT Plan Current plan remains appropriate    Co-evaluation              AM-PAC PT "6  Clicks" Mobility   Outcome Measure  Help needed turning from your back to your side while in a flat bed without using bedrails?: A Little Help needed moving from lying on your back to sitting on the side of a flat bed without using bedrails?: A Little Help needed moving to and from a bed to a chair (including a wheelchair)?: A Little Help needed standing up from a chair using your arms (e.g., wheelchair or bedside chair)?: A Little Help needed to walk in hospital room?: A Little Help needed climbing 3-5 steps with a railing? : A Lot 6 Click Score: 17    End of Session Equipment Utilized During Treatment: Gait belt Activity Tolerance: Patient tolerated treatment well Patient left: in chair;with chair alarm set;with call bell/phone within reach;with nursing/sitter in room Nurse Communication: Mobility status PT Visit Diagnosis: Unsteadiness on feet (R26.81);History of falling (Z91.81);Difficulty in walking, not elsewhere classified (R26.2);Muscle weakness (generalized) (M62.81)     Time: [id number] PT Time Calculation (min) (ACUTE ONLY): 25 min  Charges:  $Gait Training: 8-22 mins $Therapeutic Exercise: 8-22 mins                     Kreg Shropshire, DPT 02/17/2019, 1:52 PM

## 2019-02-17 NOTE — TOC Transition Note (Addendum)
Transition of Care [facility]) - CM/SW Discharge Note   Patient Details  Name: Gregory Davenport MRN: [id number] Date of Birth: 1940/08/23  Transition of Care [facility]) CM/SW Contact:  Latanya Maudlin, RN Phone Number: 02/17/2019, 4:00 PM   Clinical Narrative:  Patient has been medically cleared for discharge. Per preference patient will [facility] to [facility]. Spoke with Crystal in admissions and she can take patient today/ Negative COVID test yesterday. EMS for transport. Spoke with daughter and she has coordinated to keep HD site in [place]. Confirmed this with dialysis coordinator and chair time is 12;30 on Monday. [facility] aware of this and is able to transport. Report to call on the chart, bedside RN will call report.    Final next level of care: Skilled Nursing Facility Barriers to Discharge: No Barriers Identified   Patient Goals and CMS Choice Patient states their goals for this hospitalization and ongoing recovery are:: To go to SNF for short term rehab, then transition to long term care. CMS Medicare.gov Compare Post Acute Care list provided to:: Patient Choice offered to / list presented to : Patient, Adult Children  Discharge Placement              Patient chooses bed at: Other - please specify in the comment section below:([facility]) Patient to be transferred to facility by: EMS Name of family member notified: daughter    Discharge Plan and Services     Post Acute Care Choice: [place]                               Social Determinants of Health (SDOH) Interventions     Readmission Risk Interventions No flowsheet data found.

## 2019-02-17 NOTE — Progress Notes (Signed)
[place] [facility] [place] NAME: Gregory Davenport    MR#:  [id number]  DATE OF BIRTH:  1940/08/09  SUBJECTIVE:  CHIEF COMPLAINT:   Chief Complaint  Patient presents with  . Altered Mental Status   Came with right-sided weakness.  Currently feels weakness is improving much. Noted no acute stroke on MRI.  I have seen him in dialysis today.  No new complaints. REVIEW OF SYSTEMS:  CONSTITUTIONAL: No fever, fatigue or weakness.  EYES: No blurred or double vision.  EARS, NOSE, AND THROAT: No tinnitus or ear pain.  RESPIRATORY: No cough, shortness of breath, wheezing or hemoptysis.  CARDIOVASCULAR: No chest pain, orthopnea, edema.  GASTROINTESTINAL: No nausea, vomiting, diarrhea or abdominal pain.  GENITOURINARY: No dysuria, hematuria.  ENDOCRINE: No polyuria, nocturia,  HEMATOLOGY: No anemia, easy bruising or bleeding SKIN: No rash or lesion. MUSCULOSKELETAL: No joint pain or arthritis.   NEUROLOGIC: No tingling, numbness, weakness.  PSYCHIATRY: No anxiety or depression.   ROS  DRUG ALLERGIES:   Allergies  Allergen Reactions  . Other Other (See Comments)    Combative with opioid medications    VITALS:  Blood pressure 130/66, pulse 72, temperature 98.5 F (36.9 C), temperature source Oral, resp. rate 15, height 6' (1.829 m), weight 95 kg, SpO2 98 %.  PHYSICAL EXAMINATION:  GENERAL:  78 y.o.-year-old patient lying in the bed with no acute distress.  EYES: Pupils equal, round, reactive to light and accommodation. No scleral icterus. Extraocular muscles intact.  HEENT: Head atraumatic, normocephalic. Oropharynx and nasopharynx clear.  NECK:  Supple, no jugular venous distention. No thyroid enlargement, no tenderness.  LUNGS: Normal breath sounds bilaterally, no wheezing, rales,rhonchi or crepitation. No use of accessory muscles of respiration.  CARDIOVASCULAR: S1, S2 normal. No murmurs, rubs, or gallops.  ABDOMEN: Soft, nontender, nondistended.  Bowel sounds present. No organomegaly or mass.  EXTREMITIES: No pedal edema, cyanosis, or clubbing.  NEUROLOGIC: Cranial nerves II through XII are intact. Muscle strength 4/5 in all extremities-somewhat decreased in the right side but able to move and follows,. Sensation intact. Gait not checked.  Left foot does not have any focal point of tenderness.  No redness or skin changes. PSYCHIATRIC: The patient is alert and oriented x 3.  SKIN: No obvious rash, lesion, or ulcer.   Physical Exam LABORATORY PANEL:   CBC Recent Labs  Lab 02/17/19 0557  WBC 4.8  HGB 10.8*  HCT 34.1*  PLT 230   ------------------------------------------------------------------------------------------------------------------  Chemistries  Recent Labs  Lab 02/11/19 1127 02/12/19 0713  NA 136 138  K 3.6 3.7  CL 94* 96*  CO2 29 28  GLUCOSE 107* 116*  BUN 25* 33*  CREATININE 7.57* 9.29*  CALCIUM 9.1 9.0  AST 18  --   ALT 15  --   ALKPHOS 58  --   BILITOT 0.8  --    ------------------------------------------------------------------------------------------------------------------  Cardiac Enzymes No results for input(s): TROPONINI in the last 168 hours. ------------------------------------------------------------------------------------------------------------------  RADIOLOGY:  No results found.  ASSESSMENT AND PLAN:   Active Problems:   Right sided weakness  1.  Right-sided weakness starting  after dialysis.  CT scan of the head negative.   MRI brain is negative.  Carotid Doppler study did not show significant acute blockages.  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   LDL is 95, on atorvastatin 40 mg. Continue aspirin.   Swallow screen.  Physical therapy occupational therapy evaluations.  Monitor on telemetry.  Appreciate neurology consult. PT suggested SNF placement.  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 As he is a dialysis  patient we also need to have his dialysis arranged locally while discharging to rehab.   2.  Fever with normal white count.  Urine analysis does look positive   Follow-up blood cultures are negative,  Chest x-ray negative.     COVID-19 negative. He was given IV ceftriaxone for possible UTI.  But his urine culture now reported ESBL.  He does not have any more fever for last 3 days and feeling much better and back to normal so I will stop ceftriaxone as it is not effective and ESBL anyways.   Patient was started on ertapenem for 1 dose by covering physician but after having a consult with infectious disease, she also agreed that patient does not need to be treated for ESBL as patient have only 54 mL residual urine in bladder post void scan, and patient does not make much urine due to being on dialysis, this ESBL in urine seems to be colonization and does not need to be treated.  3.  End-stage renal disease on dialysis Monday Wednesday Friday.  Appreciated Consult nephrology.  4.  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  Decrease dose of Lantus down to 8 units and sliding scale.  Checked a hemoglobin A1c-6.9.  5.  Essential hypertension.  Continue usual medications. Allow permissive hypertension on first day and started on amlodipine.  Physical therapy suggested SNF placement.   All the records are reviewed and case discussed with Care Management/Social Workerr. Management plans discussed with the patient, family and they are in agreement.  CODE STATUS: Full  TOTAL TIME TAKING CARE OF THIS PATIENT: 35 minutes.  Likely discharge once we have approval.  Need also arrangement for hemodialysis.   POSSIBLE D/C IN 1-2 DAYS, DEPENDING ON CLINICAL CONDITION.   Vaughan Basta M.D on 02/17/2019   Between 7am to 6pm - Pager - [phone]  After 6pm go to [email or web address] - password [email or web address] [facility]  Office  [phone]  CC: Primary care  physician; Delilah Shan, MD  Note: This dictation was prepared with Dragon dictation along with smaller phrase technology. Any transcriptional errors that result from this process are unintentional.

## 2019-02-17 NOTE — Progress Notes (Signed)
Pre HD [place] Note   02/17/19 1005  Hand-Off documentation  Report given to (Full Name) Newt Minion RN   Report received from (Full Name) [facility] RN  Vital Signs  Temp 97.6 F (36.4 C)  Temp Source Oral  Pulse Rate 65  Pulse Rate Source Monitor  Resp 15  BP (!) 163/72  BP Location Left Arm  BP Method Automatic  Patient Position (if appropriate) Lying  Oxygen Therapy  SpO2 99 %  O2 Device Room Air  Pain Assessment  Pain Scale 0-10  Pain Score 0

## 2019-02-17 NOTE — Progress Notes (Signed)
Pre HD assessment   02/17/19 1010  Neurological  Level of Consciousness Alert  Orientation Level Oriented X4  Respiratory  Respiratory Pattern Regular;Unlabored  Chest Assessment Chest expansion symmetrical  Bilateral Breath Sounds Clear;Diminished  R Upper  Breath Sounds Clear;Diminished  L Upper Breath Sounds Clear  R Lower Breath Sounds Diminished  L Lower Breath Sounds Diminished  Cough Non-productive;Strong;Congested (infrequent)  Cardiac  Pulse Regular  Heart Sounds S1, S2  Jugular Venous Distention (JVD) No  ECG Monitor Yes  Cardiac Rhythm NSR  Antiarrhythmic device No  Vascular  R Radial Pulse +2  L Radial Pulse +2  R Dorsalis Pedis Pulse +1  L Dorsalis Pedis Pulse +1  Integumentary  Integumentary (WDL) X  Skin Condition Dry;Flaky  Musculoskeletal  Musculoskeletal (WDL) X  Generalized Weakness Yes  Gastrointestinal  Bowel Sounds Assessment Hypoactive  Last BM Date 02/15/19  GU Assessment  Genitourinary (WDL) X  Genitourinary Symptoms Oliguria (hemodialysis patient)  Psychosocial  Psychosocial (WDL) WDL

## 2019-02-17 NOTE — Discharge Summary (Signed)
[facility] NAME: Gregory Davenport    MR#:  [id number]  DATE OF BIRTH:  [birth year]  DATE OF ADMISSION:  02/11/2019 ADMITTING PHYSICIAN: Loletha Grayer, MD  DATE OF DISCHARGE: 02/17/2019   PRIMARY CARE PHYSICIAN: Delilah Shan, MD    ADMISSION DIAGNOSIS:  Metabolic encephalopathy 99991111 Acute cystitis without hematuria [N30.00] Right sided weakness [R53.1]  DISCHARGE DIAGNOSIS:  Active Problems:   Right sided weakness   SECONDARY DIAGNOSIS:   Past Medical History:  Diagnosis Date  . CAD (coronary artery disease)   . Diabetes mellitus without complication ([place])   . Dialysis patient ([place])   . End stage renal disease ([place])   . Heart attack ([place])   . Hypertension     HOSPITAL COURSE:  1. Right-sided weakness starting  after dialysis. CT scan of the head negative.  MRI brain is negative.  Carotid Doppler study did not show significant acute blockages.  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   LDL is 95, on atorvastatin 40 mg. Continue aspirin.  Swallow screen. Physical therapy occupational therapy evaluations. Monitor on telemetry.  Appreciate neurology consult. PT suggested SNF placement.  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 As he is a dialysis patient we also need to have his dialysis arranged locally while discharging to rehab.   2. Fever with normal white count. Urine analysis does look positive  Follow-up blood cultures are negative, Chest x-ray negative.   COVID-19 negative. He was given IV ceftriaxone for possible UTI.  But his urine culture now reported ESBL.  He does not have any more fever for last 3 days and feeling much better and back to normal so I will stop ceftriaxone as it is not effective and ESBL anyways.   Patient was started on ertapenem for 1 dose by covering physician but after having a consult with  infectious disease, she also agreed that patient does not need to be treated for ESBL as patient have only 54 mL residual urine in bladder post void scan, and patient does not make much urine due to being on dialysis, this ESBL in urine seems to be colonization and does not need to be treated.  3. End-stage renal disease on dialysis Monday Wednesday Friday.  [facility].  4. 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 Decrease dose of Lantus down to 8 units and sliding scale. Checked a hemoglobin A1c-6.9.  5. Essential hypertension. Continue usual medications. Allow permissive hypertension on first day and started on amlodipine. Resume betablocker.  Physical therapy suggested SNF placement.   DISCHARGE CONDITIONS:   Stable.  CONSULTS OBTAINED:  Treatment Team:  Neville Route, MD  DRUG ALLERGIES:   Allergies  Allergen Reactions  . Other Other (See Comments)    Combative with opioid medications    DISCHARGE MEDICATIONS:   Allergies as of 02/17/2019      Reactions   Other Other (See Comments)   Combative with opioid medications      Medication List    STOP taking these medications   gabapentin 100 MG capsule Commonly known as: NEURONTIN     TAKE these medications   amLODipine 10 MG tablet Commonly known as: NORVASC Take 10 mg by mouth daily.   aspirin 81 MG EC tablet Take 1 tablet (81 mg total) by mouth daily. Start taking on: February 18, 2019   atorvastatin 40 MG tablet Commonly known  as: LIPITOR Take 40 mg by mouth daily.   brimonidine 0.2 % ophthalmic solution Commonly known as: ALPHAGAN Place 1 drop into the right eye 2 (two) times daily.   carvedilol 12.5 MG tablet Commonly known as: COREG Take 12.5 mg by mouth 2 (two) times daily.   dorzolamide-timolol 22.3-6.8 MG/ML ophthalmic solution Commonly known as: COSOPT Place 1 drop into both eyes 2 (two) times daily.   insulin glargine 100 UNIT/ML  injection Commonly known as: LANTUS Inject 0.08 mLs (8 Units total) into the skin at bedtime. What changed: how much to take   pantoprazole 40 MG tablet Commonly known as: PROTONIX Take 40 mg by mouth daily.   tamsulosin 0.4 MG Caps capsule Commonly known as: FLOMAX Take 0.4 mg by mouth daily.        DISCHARGE INSTRUCTIONS:   follow with PMD in 1-2 weeks.   If you experience worsening of your admission symptoms, develop shortness of breath, life threatening emergency, suicidal or homicidal thoughts you must seek medical attention immediately by calling 911 or calling your MD immediately  if symptoms less severe.  You Must read complete instructions/literature along with all the possible adverse reactions/side effects for all the Medicines you take and that have been prescribed to you. Take any new Medicines after you have completely understood and accept all the possible adverse reactions/side effects.   Please note  You were cared for by a hospitalist during your hospital stay. If you have any questions about your discharge medications or the care you received while you were in the hospital after you are discharged, you can call the unit and asked to speak with the hospitalist on call if the hospitalist that took care of you is not available. Once you are discharged, your primary care physician will handle any further medical issues. Please note that NO REFILLS for any discharge medications will be authorized once you are discharged, as it is imperative that you return to your primary care physician (or establish a relationship with a primary care physician if you do not have one) for your aftercare needs so that they can reassess your need for medications and monitor your lab values.    Today   CHIEF COMPLAINT:   Chief Complaint  Patient presents with  . Altered Mental Status    HISTORY OF PRESENT ILLNESS:  Gregory Davenport  is a 78 y.o. male with a known history of end-stage  renal disease.  He states that he feels terrible. After dialysis yesterday he felt a little bit weak.  Daughter states he had a fever of 100.9.  He did have some confusion.  She felt that his right side was weaker. Today they could not even transfer him.  Normally is able to walk a little bit with a walker.  The patient had a COVID test which was negative, chest x-ray was negative.  Urine analysis looked positive.  He only urinates once per day.  Patient also having right-sided weakness and can hardly lift his right leg up off the bed and his grip strength is a little weaker with his right arm.  Hospitalist services contacted for further evaluation.   VITAL SIGNS:  Blood pressure 130/66, pulse 72, temperature 98.5 F (36.9 C), temperature source Oral, resp. rate 15, height 6' (1.829 m), weight 95 kg, SpO2 98 %.  I/O:    Intake/Output Summary (Last 24 hours) at 02/17/2019 1522 Last data filed at 02/17/2019 1415 Gross per 24 hour  Intake 60 ml  Output  1713 ml  Net -1653 ml    PHYSICAL EXAMINATION:   GENERAL:  79 y.o.-year-old patient lying in the bed with no acute distress.  EYES: Pupils equal, round, reactive to light and accommodation. No scleral icterus. Extraocular muscles intact.  HEENT: Head atraumatic, normocephalic. Oropharynx and nasopharynx clear.  NECK:  Supple, no jugular venous distention. No thyroid enlargement, no tenderness.  LUNGS: Normal breath sounds bilaterally, no wheezing, rales,rhonchi or crepitation. No use of accessory muscles of respiration.  CARDIOVASCULAR: S1, S2 normal. No murmurs, rubs, or gallops.  ABDOMEN: Soft, nontender, nondistended. Bowel sounds present. No organomegaly or mass.  EXTREMITIES: No pedal edema, cyanosis, or clubbing.  NEUROLOGIC: Cranial nerves II through XII are intact. Muscle strength 4/5 in all extremities-somewhat decreased in the right side but able to move and follows,. Sensation intact. Gait not checked.   PSYCHIATRIC: The patient is  alert and oriented x 3.  SKIN: No obvious rash, lesion, or ulcer.    DATA REVIEW:   CBC Recent Labs  Lab 02/17/19 0557  WBC 4.8  HGB 10.8*  HCT 34.1*  PLT 230    Chemistries  Recent Labs  Lab 02/11/19 1127 02/12/19 0713  NA 136 138  K 3.6 3.7  CL 94* 96*  CO2 29 28  GLUCOSE 107* 116*  BUN 25* 33*  CREATININE 7.57* 9.29*  CALCIUM 9.1 9.0  AST 18  --   ALT 15  --   ALKPHOS 58  --   BILITOT 0.8  --     Cardiac Enzymes No results for input(s): TROPONINI in the last 168 hours.  Microbiology Results  Results for orders placed or performed during the hospital encounter of 02/11/19  Blood Culture (routine x 2)     Status: None   Collection Time: 02/11/19 11:27 AM   Specimen: BLOOD  Result Value Ref Range Status   Specimen Description BLOOD BLOOD LEFT HAND  Final   Special Requests   Final    BOTTLES DRAWN AEROBIC AND ANAEROBIC Blood Culture adequate volume   Culture   Final    NO GROWTH 5 DAYS Performed at [facility], [address] [place], [place] [address]    Report Status 02/16/2019 FINAL  Final  Blood Culture (routine x 2)     Status: None   Collection Time: 02/11/19 11:28 AM   Specimen: BLOOD  Result Value Ref Range Status   Specimen Description BLOOD BLOOD LEFT ARM  Final   Special Requests   Final    BOTTLES DRAWN AEROBIC AND ANAEROBIC Blood Culture adequate volume   Culture   Final    NO GROWTH 5 DAYS Performed at [facility], [address] [place], [place] [address]    Report Status 02/16/2019 FINAL  Final  Urine culture     Status: Abnormal   Collection Time: 02/11/19  1:10 PM   Specimen: In/Out Cath Urine  Result Value Ref Range Status   Specimen Description   Final    IN/OUT CATH URINE Performed at [facility], [address] [place], [place] [address]    Special Requests   Final    NONE Performed at [facility], [address] [place],  [address]    Culture (A)  Final     >=100,000 COLONIES/mL ESCHERICHIA COLI Confirmed Extended Spectrum Beta-Lactamase Producer (ESBL).  In bloodstream infections from ESBL organisms, carbapenems are preferred over piperacillin/tazobactam. They are shown to have a lower risk of mortality.    Report Status 02/14/2019  FINAL  Final   Organism ID, Bacteria ESCHERICHIA COLI (A)  Final      Susceptibility   Escherichia coli - MIC*    AMPICILLIN >=32 RESISTANT Resistant     CEFAZOLIN >=64 RESISTANT Resistant     CEFTRIAXONE >=64 RESISTANT Resistant     CIPROFLOXACIN >=4 RESISTANT Resistant     GENTAMICIN >=16 RESISTANT Resistant     IMIPENEM <=0.25 SENSITIVE Sensitive     NITROFURANTOIN <=16 SENSITIVE Sensitive     TRIMETH/SULFA >=320 RESISTANT Resistant     AMPICILLIN/SULBACTAM >=32 RESISTANT Resistant     PIP/TAZO 8 SENSITIVE Sensitive     Extended ESBL POSITIVE Resistant     * >=100,000 COLONIES/mL ESCHERICHIA COLI  SARS Coronavirus 2 by RT PCR (hospital order, performed in [facility] hospital lab) Nasopharyngeal Nasopharyngeal Swab     Status: None   Collection Time: 02/11/19  1:10 PM   Specimen: Nasopharyngeal Swab  Result Value Ref Range Status   SARS Coronavirus 2 NEGATIVE NEGATIVE Final    Comment: (NOTE) If result is NEGATIVE SARS-CoV-2 target nucleic acids are NOT DETECTED. The SARS-CoV-2 RNA is generally detectable in upper and lower  respiratory specimens during the acute phase of infection. The lowest  concentration of SARS-CoV-2 viral copies this assay can detect is 250  copies / mL. A negative result does not preclude SARS-CoV-2 infection  and should not be used as the sole basis for treatment or other  patient management decisions.  A negative result may occur with  improper specimen collection / handling, submission of specimen other  than nasopharyngeal swab, presence of viral mutation(s) within the  areas targeted by this assay, and inadequate number of viral copies  (<250 copies / mL). A negative result  must be combined with clinical  observations, patient history, and epidemiological information. If result is POSITIVE SARS-CoV-2 target nucleic acids are DETECTED. The SARS-CoV-2 RNA is generally detectable in upper and lower  respiratory specimens dur ing the acute phase of infection.  Positive  results are indicative of active infection with SARS-CoV-2.  Clinical  correlation with patient history and other diagnostic information is  necessary to determine patient infection status.  Positive results do  not rule out bacterial infection or co-infection with other viruses. If result is PRESUMPTIVE POSTIVE SARS-CoV-2 nucleic acids MAY BE PRESENT.   A presumptive positive result was obtained on the submitted specimen  and confirmed on repeat testing.  While 2019 novel coronavirus  (SARS-CoV-2) nucleic acids may be present in the submitted sample  additional confirmatory testing may be necessary for epidemiological  and / or clinical management purposes  to differentiate between  SARS-CoV-2 and other Sarbecovirus currently known to infect humans.  If clinically indicated additional testing with an alternate test  methodology [phone]) is advised. The SARS-CoV-2 RNA is generally  detectable in upper and lower respiratory sp ecimens during the acute  phase of infection. The expected result is Negative. Fact Sheet for Patients:  StrictlyIdeas.no Fact Sheet for Healthcare Providers: BankingDealers.co.za This test is not yet approved or cleared by the [place] FDA and has been authorized for detection and/or diagnosis of SARS-CoV-2 by FDA under an Emergency Use Authorization (EUA).  This EUA will remain in effect (meaning this test can be used) for the duration of the COVID-19 declaration under Section 564(b)(1) of the Act, 21 U.S.C. section 360bbb-3(b)(1), unless the authorization is terminated or revoked sooner. Performed at [facility], [address], [place], [place] [address]   SARS CORONAVIRUS  2 (TAT 6-24 HRS) Nasopharyngeal Nasopharyngeal Swab     Status: None   Collection Time: 02/13/19  5:20 PM   Specimen: Nasopharyngeal Swab  Result Value Ref Range Status   SARS Coronavirus 2 NEGATIVE NEGATIVE Final    Comment: (NOTE) SARS-CoV-2 target nucleic acids are NOT DETECTED. The SARS-CoV-2 RNA is generally detectable in upper and lower respiratory specimens during the acute phase of infection. Negative results do not preclude SARS-CoV-2 infection, do not rule out co-infections with other pathogens, and should not be used as the sole basis for treatment or other patient management decisions. Negative results must be combined with clinical observations, patient history, and epidemiological information. The expected result is Negative. Fact Sheet for Patients: SugarRoll.be Fact Sheet for Healthcare Providers: https://www.woods-mathews.com/ This test is not yet approved or cleared by the [place] FDA and  has been authorized for detection and/or diagnosis of SARS-CoV-2 by FDA under an Emergency Use Authorization (EUA). This EUA will remain  in effect (meaning this test can be used) for the duration of the COVID-19 declaration under Section 56 4(b)(1) of the Act, 21 U.S.C. section 360bbb-3(b)(1), unless the authorization is terminated or revoked sooner. Performed at [facility], [place] [address], [place], [place] [address]   SARS CORONAVIRUS 2 (TAT 6-24 HRS) Nasopharyngeal Nasopharyngeal Swab     Status: None   Collection Time: 02/16/19  4:37 PM   Specimen: Nasopharyngeal Swab  Result Value Ref Range Status   SARS Coronavirus 2 NEGATIVE NEGATIVE Final    Comment: (NOTE) SARS-CoV-2 target nucleic acids are NOT DETECTED. The SARS-CoV-2 RNA is generally detectable in upper and lower respiratory specimens during the acute phase of infection.  Negative results do not preclude SARS-CoV-2 infection, do not rule out co-infections with other pathogens, and should not be used as the sole basis for treatment or other patient management decisions. Negative results must be combined with clinical observations, patient history, and epidemiological information. The expected result is Negative. Fact Sheet for Patients: SugarRoll.be Fact Sheet for Healthcare Providers: https://www.woods-mathews.com/ This test is not yet approved or cleared by the [place] FDA and  has been authorized for detection and/or diagnosis of SARS-CoV-2 by FDA under an Emergency Use Authorization (EUA). This EUA will remain  in effect (meaning this test can be used) for the duration of the COVID-19 declaration under Section 56 4(b)(1) of the Act, 21 U.S.C. section 360bbb-3(b)(1), unless the authorization is terminated or revoked sooner. Performed at [facility], [place] [address] [place], [place] [address]     RADIOLOGY:  No results found.  EKG:   Orders placed or performed during the hospital encounter of 02/11/19  . ED EKG 12-Lead  . ED EKG 12-Lead  . EKG 12-Lead  . EKG 12-Lead      Management plans discussed with the patient, family and they are in agreement.  CODE STATUS:     Code Status Orders  (From admission, onward)         Start     Ordered   02/11/19 1701  Full code  Continuous     02/11/19 1700        Code Status History    This patient has a current code status but no historical code status.   Advance Care Planning Activity      TOTAL TIME TAKING CARE OF THIS PATIENT: 35 minutes.    Vaughan Basta M.D on 02/17/2019 at 3:22 PM  Between 7am to 6pm - Pager - [phone]  After 6pm go to [email or web address] - password [email or web address] [facility]  Office  [phone]  CC: Primary care physician; Delilah Shan, MD   Note: This dictation was  prepared with Dragon dictation along with smaller phrase technology. Any transcriptional errors that result from this process are unintentional.

## 2019-02-17 NOTE — Progress Notes (Signed)
HD Tx started    02/17/19 1010  Vital Signs  Pulse Rate 63  Resp 16  BP (!) 154/71  Oxygen Therapy  SpO2 99 %  O2 Device Room Air  Pain Assessment  Pain Scale 0-10  Pain Score 0  During Hemodialysis Assessment  Blood Flow Rate (mL/min) 400 mL/min  Arterial Pressure (mmHg) -140 mmHg  Venous Pressure (mmHg) 140 mmHg  Transmembrane Pressure (mmHg) 70 mmHg  Ultrafiltration Rate (mL/min) 570 mL/min  Dialysate Flow Rate (mL/min) 800 ml/min  Conductivity: Machine  13.9  HD Safety Checks Performed Yes  Dialysis Fluid Bolus Normal Saline  Bolus Amount (mL) 250 mL  Intra-Hemodialysis Comments Tx initiated

## 2019-02-17 NOTE — Plan of Care (Signed)
  Problem: Education: Goal: Knowledge of General Education information will improve Description: Including pain rating scale, medication(s)/side effects and non-pharmacologic comfort measures Outcome: Progressing    Problem: Clinical Measurements: Goal: Ability to maintain clinical measurements within normal limits will improve Outcome: Progressing Goal: Will remain free from infection Outcome: Progressing Goal: Diagnostic test results will improve Outcome: Progressing Goal: Respiratory complications will improve Outcome: Progressing Goal: Cardiovascular complication will be avoided Outcome: Progressing  Pt received Dialysis Tx and tolerated well.

## 2019-02-17 NOTE — Progress Notes (Signed)
HD Tx completed   02/17/19 1345  Vital Signs  Pulse Rate 66  Pulse Rate Source Monitor  Resp 15  BP 116/62  BP Location Left Arm  BP Method Automatic  Patient Position (if appropriate) Lying  Oxygen Therapy  SpO2 98 %  O2 Device Room Air  Pain Assessment  Pain Scale 0-10  Pain Score 0  During Hemodialysis Assessment  Blood Flow Rate (mL/min) 400 mL/min  Arterial Pressure (mmHg) -170 mmHg  Venous Pressure (mmHg) 220 mmHg  Transmembrane Pressure (mmHg) 70 mmHg  Ultrafiltration Rate (mL/min) 570 mL/min  Dialysate Flow Rate (mL/min) 800 ml/min  Conductivity: Machine  14  HD Safety Checks Performed Yes  Intra-Hemodialysis Comments Tx completed

## 2019-02-17 NOTE — Progress Notes (Signed)
Contacted yesterday about patient being accepted at [facility] in [place] for rehab. Since this facility is in [facility] the referral sent to [facility] needed to be rerouted to [facility] which is in [facility] and will be able to transport to center. Unfortunately, due to this last minute change, clinic acceptance has not been cleared. Per [place] patient acceptance will not be completed until next week. I will follow up on Monday for acceptance and chair time.

## 2021-06-15 IMAGING — MR MR HEAD W/O CM
10 series · 48 of 48 positions shown · non-contrast
Comparison: CT head earlier in the day.

CLINICAL DATA: Altered mental status. Recent dialysis.
Hypertensive. Confusion.

EXAM:
MRI HEAD WITHOUT CONTRAST
TECHNIQUE: Multiplanar, multiecho pulse sequences of the brain and surrounding
structures were obtained without intravenous contrast.

[Series 7: ax dwi_tracew · axial · 3.0mm · 1.31mm/px · z∈[-20,+130]mm · 8 of 46 slices shown]
[im 1/46]
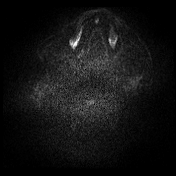
[im 7/46]
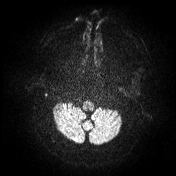
[im 13/46]
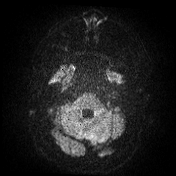
[im 20/46]
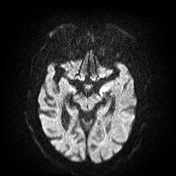
[im 26/46]
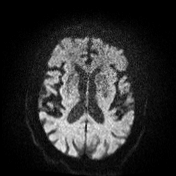
[im 33/46]
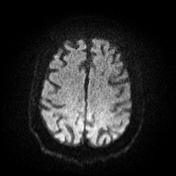
[im 39/46]
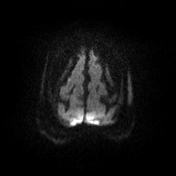
[im 46/46]
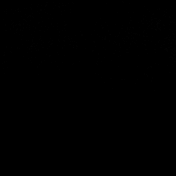

[Series 8: ax dwi_adc · axial · 3.0mm · 1.31mm/px · z∈[-20,+124]mm · 7 of 46 slices shown]
[im 1/46]
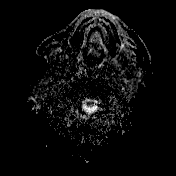
[im 8/46]
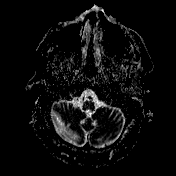
[im 16/46]
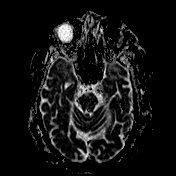
[im 23/46]
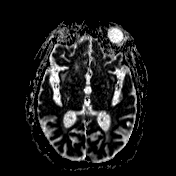
[im 31/46]
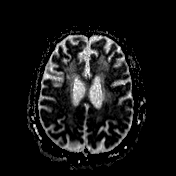
[im 38/46]
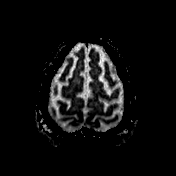
[im 46/46]
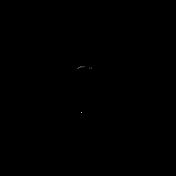

[Series 9: cor dwi_tracew · coronal · 5.0mm · 1.31mm/px · 5 of 38 slices shown]
[im 1/38]
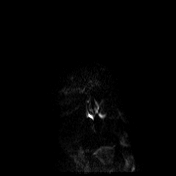
[im 10/38]
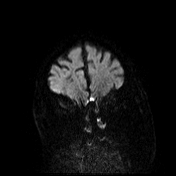
[im 19/38]
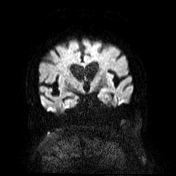
[im 28/38]
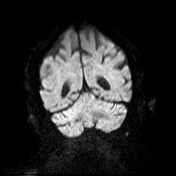
[im 38/38]
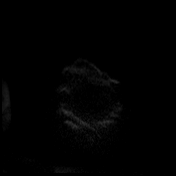

[Series 10: cor dwi_adc · coronal · 5.0mm · 1.31mm/px · 5 of 38 slices shown]
[im 1/38]
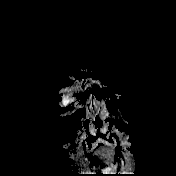
[im 10/38]
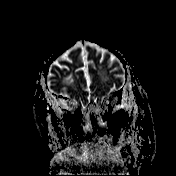
[im 19/38]
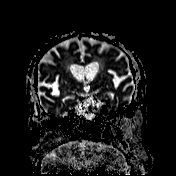
[im 28/38]
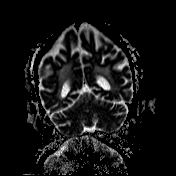
[im 38/38]
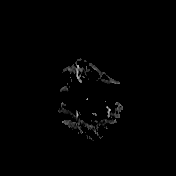

[Series 11: T1 · sagittal · 5.0mm · 0.94mm/px · 3 of 23 slices shown (1 of 2)]
[im 1/23]
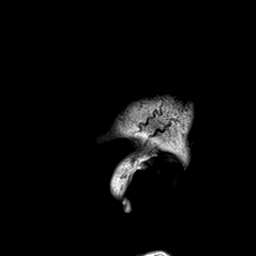
[im 12/23]
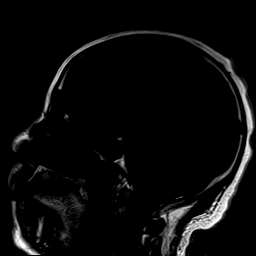
[im 23/23]
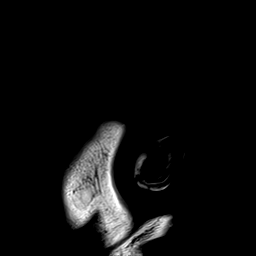

[Series 12: T2 · axial · 5.0mm · 0.45mm/px · z∈[-17,+150]mm · 4 of 29 slices shown (1 of 2)]
[im 1/29]
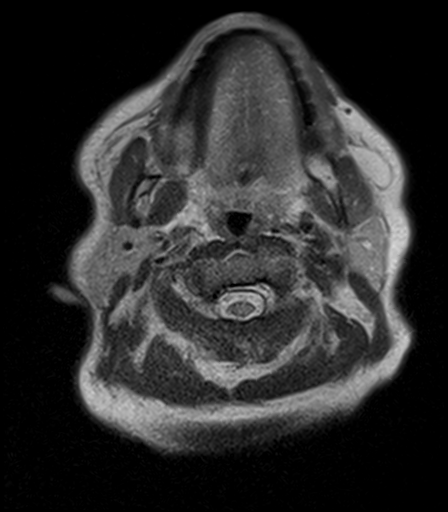
[im 10/29]
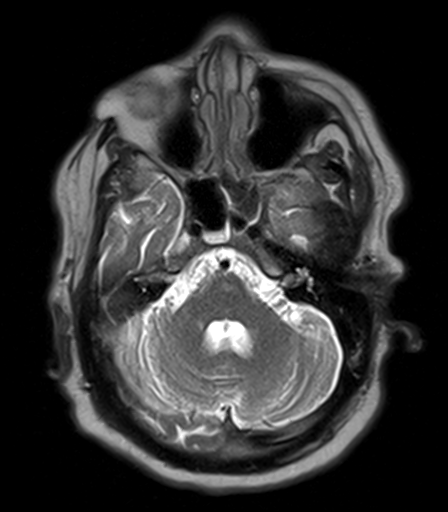
[im 19/29]
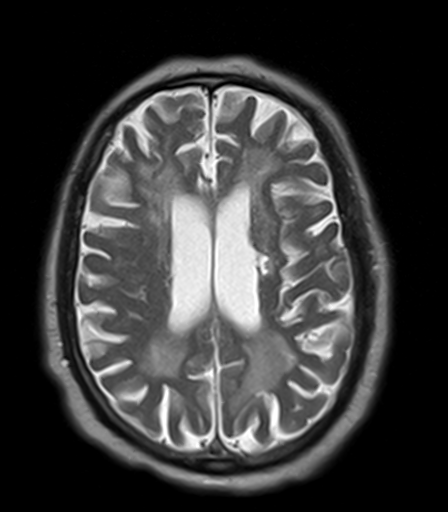
[im 29/29]
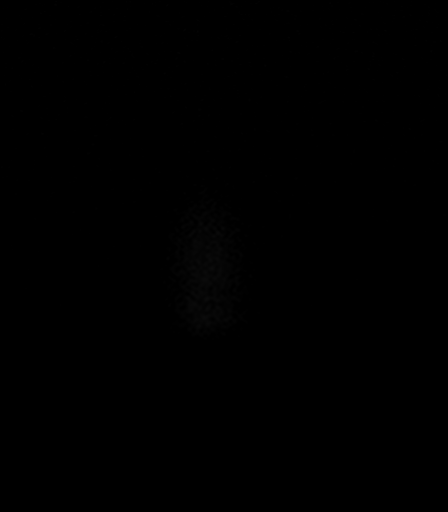

[Series 13: T2-star · axial · 5.0mm · 0.45mm/px · z∈[-17,+150]mm · 4 of 29 slices shown]
[im 1/29]
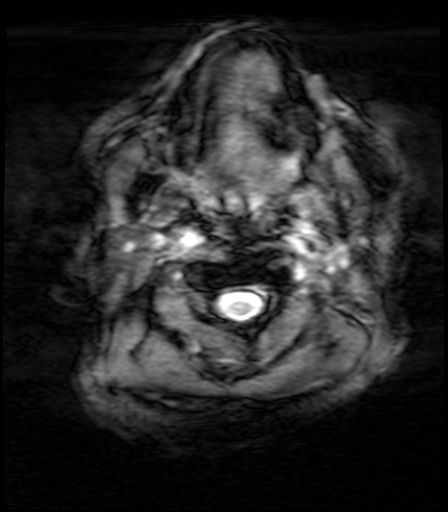
[im 10/29]
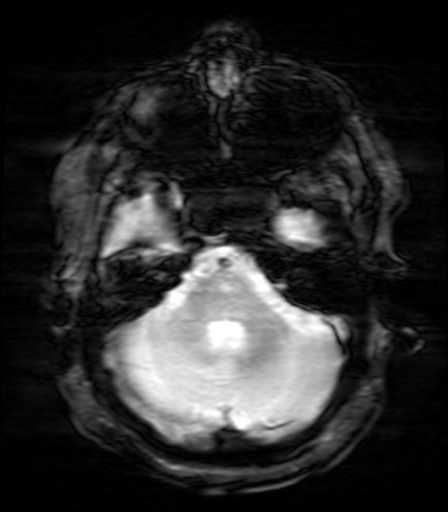
[im 19/29]
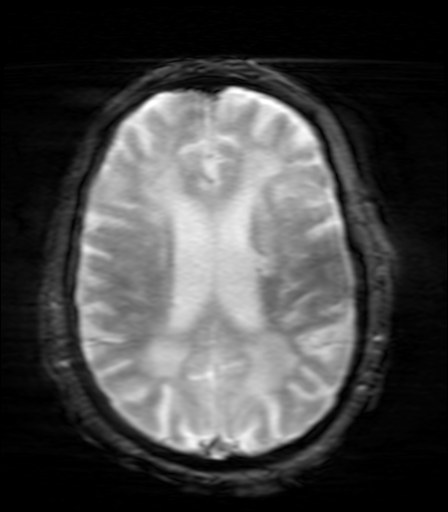
[im 29/29]
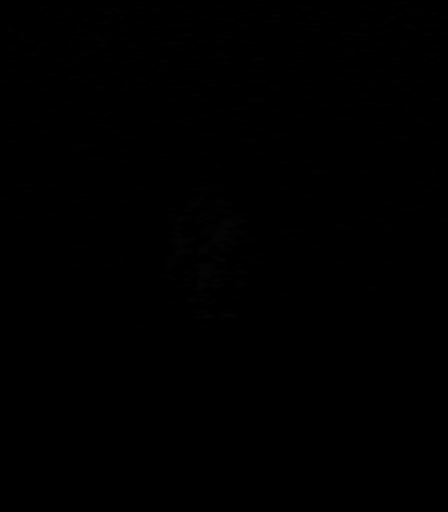

[Series 14: FLAIR · axial · 5.0mm · 1.20mm/px · z∈[-17,+150]mm · 4 of 29 slices shown]
[im 1/29]
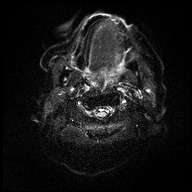
[im 10/29]
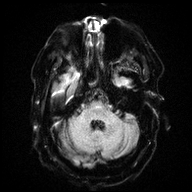
[im 19/29]
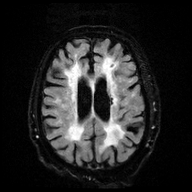
[im 29/29]
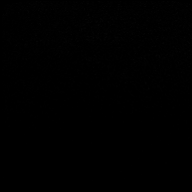

[Series 15: T1 · axial · 5.0mm · 0.90mm/px · z∈[-17,+150]mm · 4 of 29 slices shown (2 of 2)]
[im 1/29]
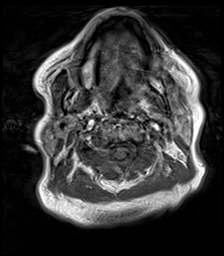
[im 10/29]
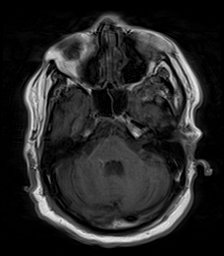
[im 19/29]
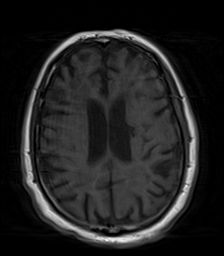
[im 29/29]
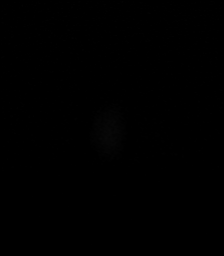

[Series 16: T2 · coronal · 5.0mm · 0.45mm/px · 4 of 31 slices shown (2 of 2)]
[im 1/31]
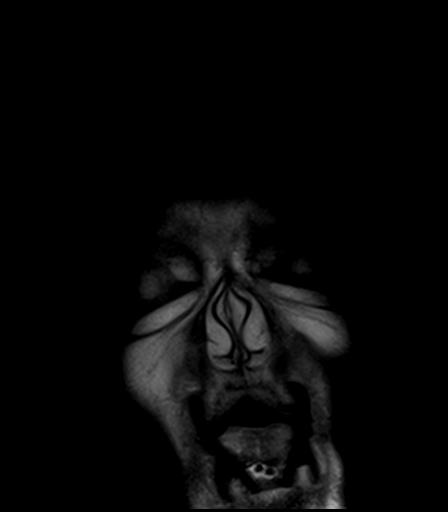
[im 11/31]
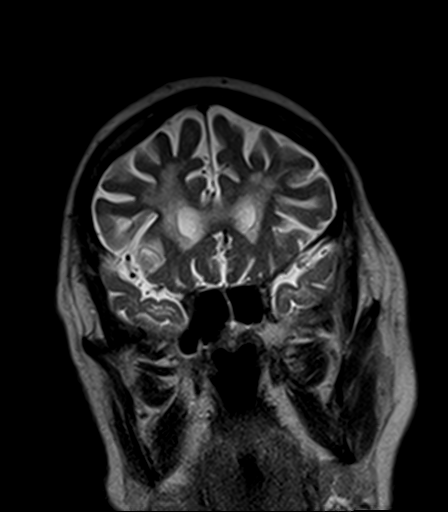
[im 21/31]
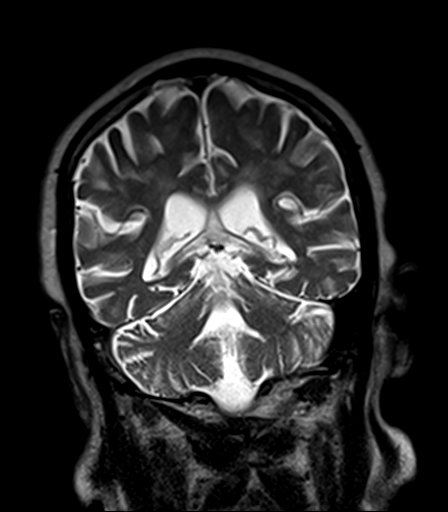
[im 31/31]
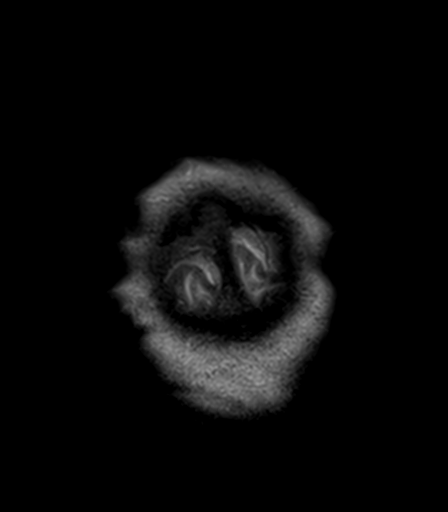

[48 of 48 positions shown; findings below may reference images not displayed]

FINDINGS: Brain: No evidence for acute infarction, hemorrhage, mass lesion,
hydrocephalus, or extra-axial fluid. Generalized atrophy. Advanced
T2 and FLAIR hyperintensities throughout the white matter,
consistent with chronic microvascular ischemic change. Prominent
perivascular spaces suggesting longstanding hypertension. Multiple
areas of chronic lacunar infarction in the deep nuclei and white
matter, most notable LEFT centrum semiovale.

Vascular: Flow voids are maintained throughout the carotid, basilar,
and vertebral arteries. There are no areas of chronic hemorrhage.

Skull and upper cervical spine: Unremarkable visualized calvarium,
skullbase, and cervical vertebrae. Pituitary, pineal, cerebellar
tonsils unremarkable. No upper cervical cord lesions.

Sinuses/Orbits: No orbital masses or proptosis. Globes appear
symmetric. BILATERAL cataract extraction. Sinuses appear well
aerated, except for trace layering sphenoid fluid.

Other: None.
IMPRESSION: Atrophy and small vessel disease. No acute intracranial findings.

## 2021-06-17 IMAGING — US US RENAL
1 series · 14 of 25 positions shown · non-contrast
Comparison: None.

CLINICAL DATA: Recurrent UTIs.

EXAM:
RENAL / URINARY TRACT ULTRASOUND COMPLETE

[Series 1: us renal · 14 of 54 slices shown]
[im 1/54]
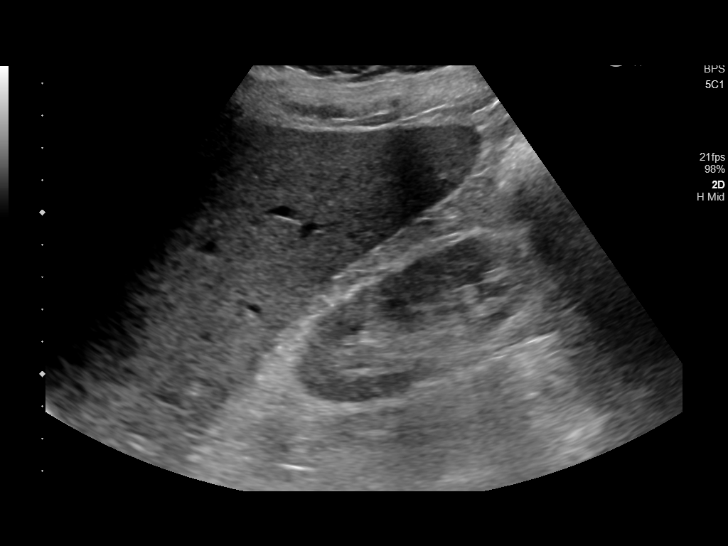
[im 5/54]
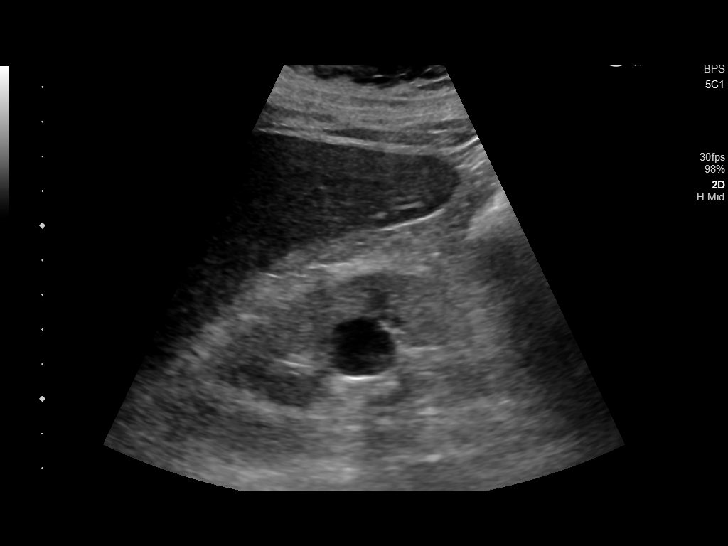
[im 9/54]
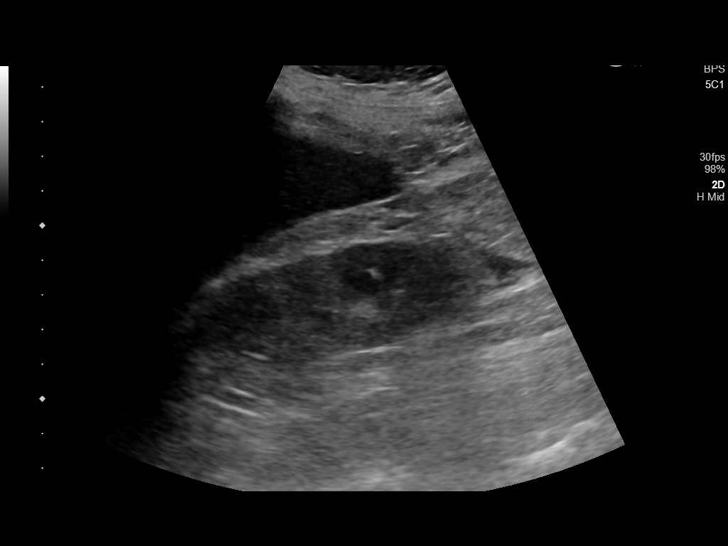
[im 14/54]
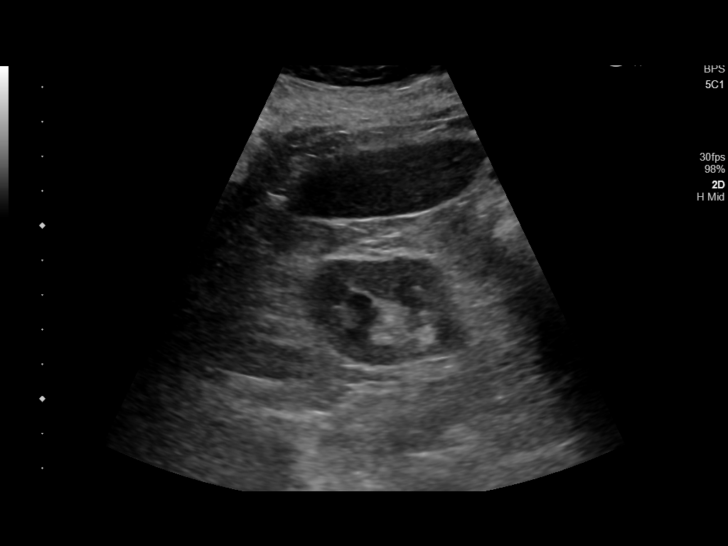
[im 18/54]
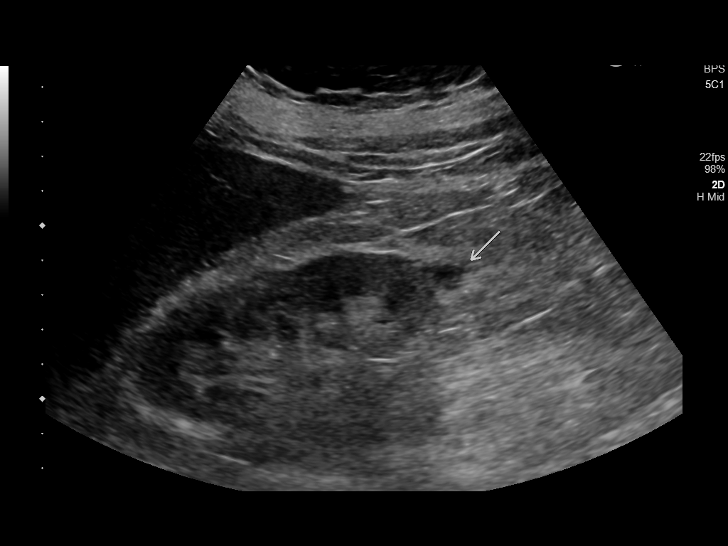
[im 20/54]
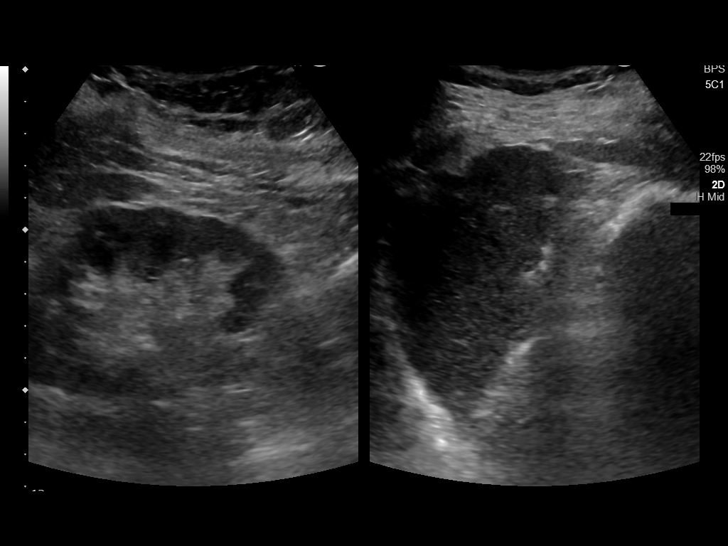
[im 25/54]
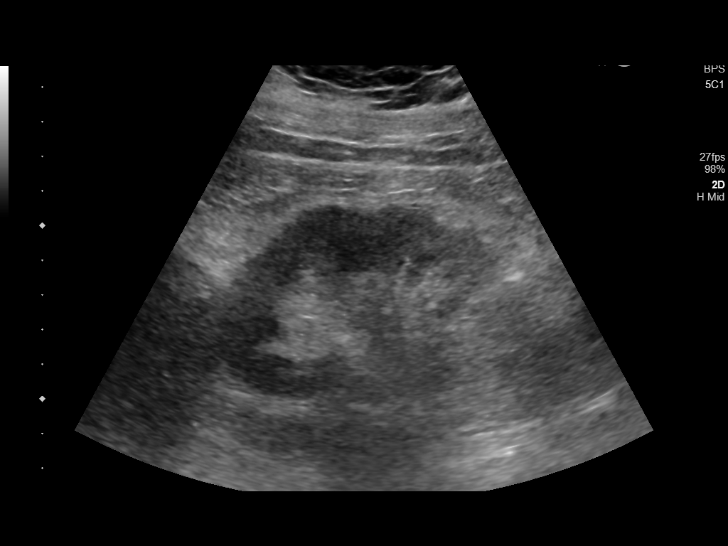
[im 29/54]
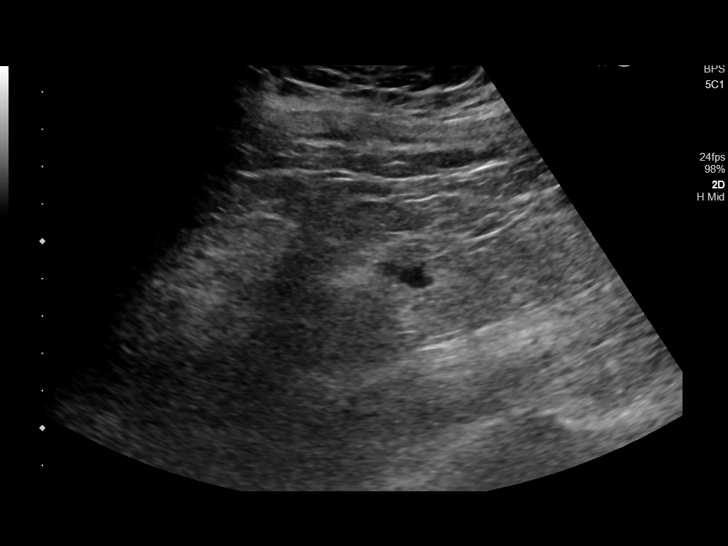
[im 34/54]
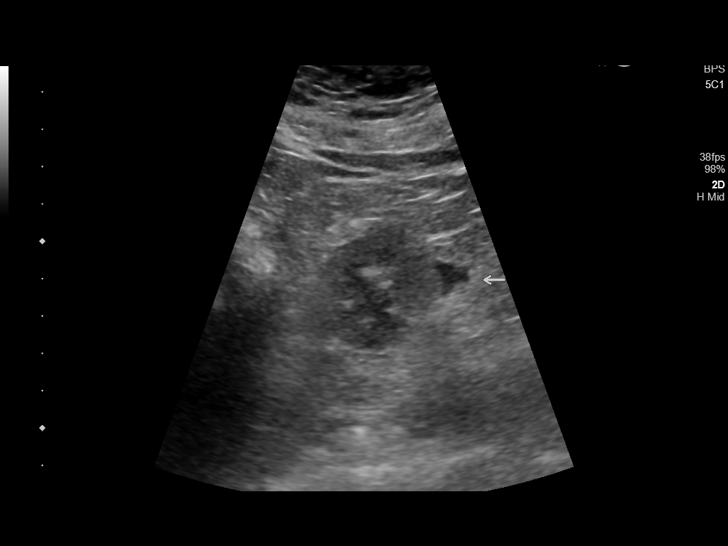
[im 36/54]
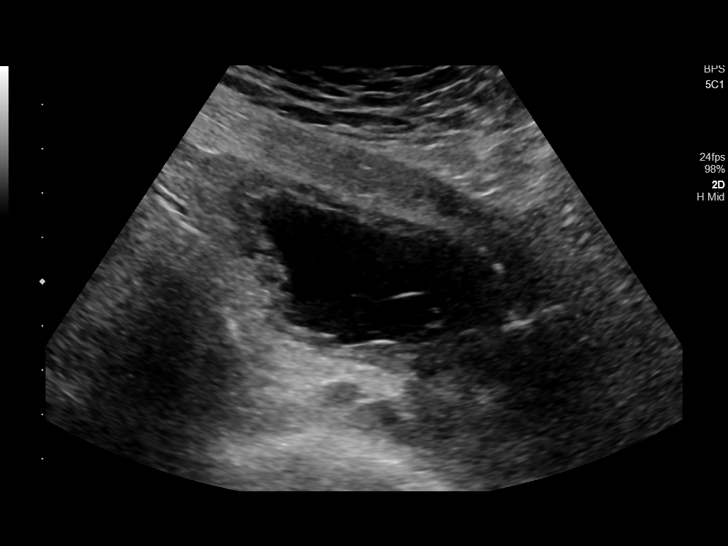
[im 40/54]
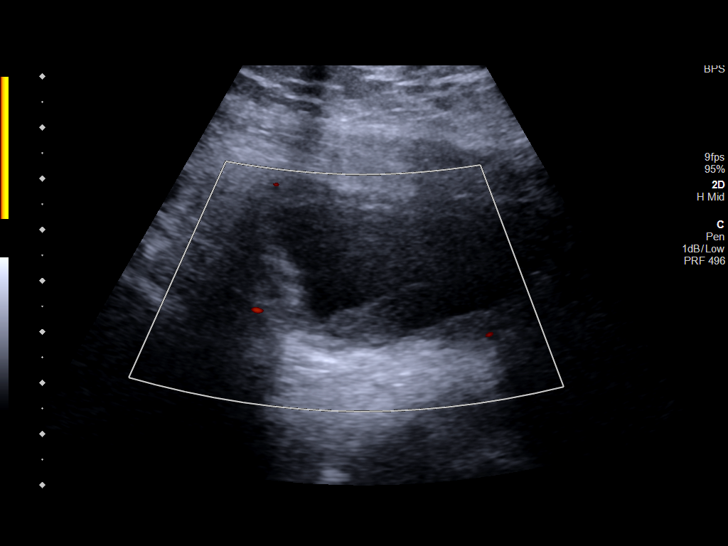
[im 45/54]
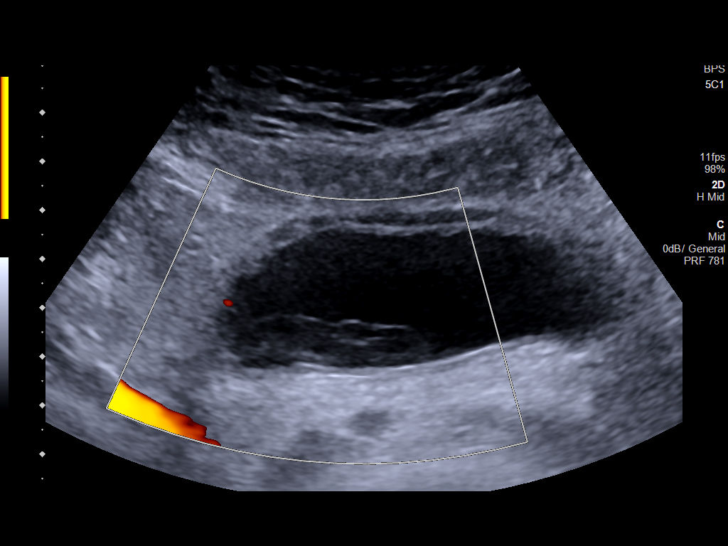
[im 49/54]
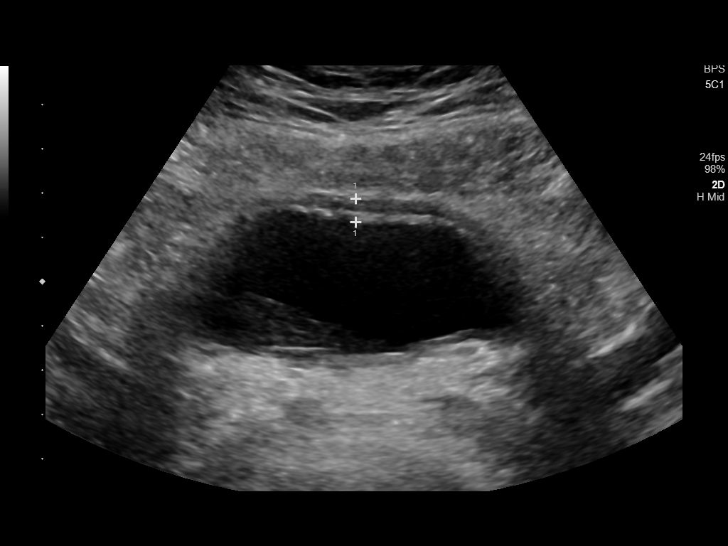
[im 54/54]
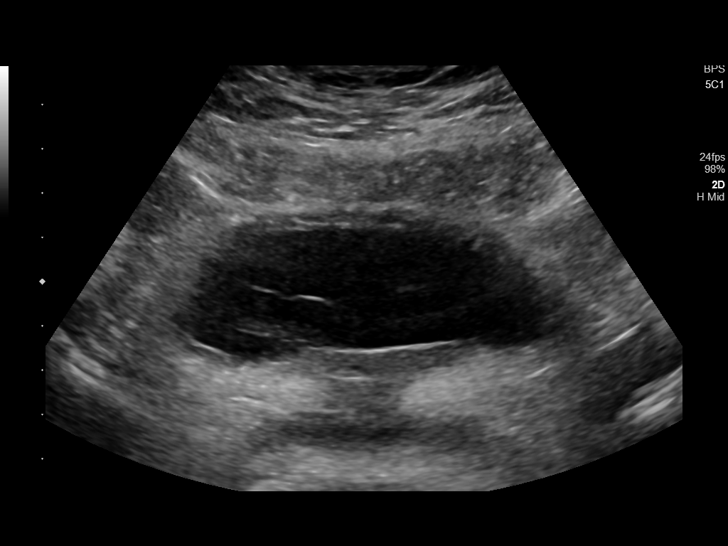

[14 of 25 positions shown; findings below may reference images not displayed]

FINDINGS: Right Kidney:

Renal measurements: 9.4 x 3.6 x 4.5 cm = volume: 79.6 mL. Contains a
1.9 cm cyst. There is a small amount of fluid adjacent to the
inferior pole of the right kidney.

Left Kidney:

Renal measurements: 9.0 x 4.7 x 3.3 cm = volume: 74.6 mL. There is a
small amount of fluid adjacent to the inferior pole the left kidney.

Bladder:

Bladder wall thickening.  Mobile debris seen in the bladder.
IMPRESSION: 1. Bladder wall thickening and bladder debris may be due to the
patient's reported recurrent UTIs.
2. There is a small amount of fluid adjacent to both kidneys,
nonspecific. There is a 1.9 cm cyst in the right kidney.

## 2022-07-03 DEATH — deceased
